# Patient Record
Sex: Female | Born: 1990 | Race: Black or African American | Hispanic: No | Marital: Married | Smoking: Never smoker
Health system: Southern US, Community
[De-identification: ages and names within clinical notes are randomized; demographics above are authoritative.]

## PROBLEM LIST (undated history)

## (undated) ENCOUNTER — Inpatient Hospital Stay (HOSPITAL_COMMUNITY): Payer: Self-pay

## (undated) ENCOUNTER — Emergency Department (HOSPITAL_COMMUNITY): Payer: Self-pay

## (undated) DIAGNOSIS — R200 Anesthesia of skin: Secondary | ICD-10-CM

## (undated) DIAGNOSIS — R202 Paresthesia of skin: Secondary | ICD-10-CM

## (undated) DIAGNOSIS — O9902 Anemia complicating childbirth: Secondary | ICD-10-CM

## (undated) DIAGNOSIS — E559 Vitamin D deficiency, unspecified: Secondary | ICD-10-CM

## (undated) HISTORY — DX: Anesthesia of skin: R20.0

## (undated) HISTORY — DX: Vitamin D deficiency, unspecified: E55.9

## (undated) HISTORY — DX: Paresthesia of skin: R20.2

## (undated) HISTORY — PX: EXTERNAL EAR SURGERY: SHX627

---

## 2012-10-31 ENCOUNTER — Ambulatory Visit (INDEPENDENT_AMBULATORY_CARE_PROVIDER_SITE_OTHER): Payer: BC Managed Care – PPO | Admitting: Neurology

## 2012-10-31 ENCOUNTER — Encounter: Payer: Self-pay | Admitting: Neurology

## 2012-10-31 VITALS — BP 118/80 | HR 62 | Ht 67.0 in | Wt 220.0 lb

## 2012-10-31 DIAGNOSIS — R209 Unspecified disturbances of skin sensation: Secondary | ICD-10-CM

## 2012-10-31 DIAGNOSIS — E559 Vitamin D deficiency, unspecified: Secondary | ICD-10-CM | POA: Insufficient documentation

## 2012-10-31 DIAGNOSIS — R202 Paresthesia of skin: Secondary | ICD-10-CM | POA: Insufficient documentation

## 2012-10-31 DIAGNOSIS — R200 Anesthesia of skin: Secondary | ICD-10-CM | POA: Insufficient documentation

## 2012-10-31 NOTE — Progress Notes (Signed)
[name] is a 22 years old right-handed African American female, referred by the primary care at [facility] G. Dr. Lily Peer for evaluation of numbness  In December 2013, she noticed intermittent numbness involving bilateral anterior thigh and, also warming sensation, each episode lasting few minutes but multiple episodes throughout the day, the symptoms last for a few months, during that episode she denies gait difficulty, no muscle weakness, no bowel bladder incontinence no low back pain  Recent few weeks, she also noticed intermittent bilateral fingertips paresthesia, involving both right and left, no weakness, no visual change, no dysarthria  Laboratory evaluation showed a normal CMP, CPK A1c CBC TSH, that was documented low vitamin D, 22 ng per ML, she is on supplement,  Review of Systems  Out of a complete 14 system review, the patient complains of only the following symptoms, and all other reviewed systems are negative.   Constitutional:   N/A Cardiovascular:  N/A Ear/Nose/Throat:  N/A Skin: Itching Eyes: N/A Respiratory: N/A Gastroitestinal: N/A    Hematology/Lymphatic:  N/A Endocrine:  N/A Musculoskeletal: Cramps Allergy/Immunology: N/A Neurological: Headache, numbness, weakness tremor Psychiatric:    N/A  PHYSICAL EXAMINATOINS:  Generalized: In no acute distress  Neck: Supple, no carotid bruits   Cardiac: Regular rate rhythm  Pulmonary: Clear to auscultation bilaterally  Musculoskeletal: No deformity  Neurological examination  Mentation: Alert oriented to time, place, history taking, and causual conversation  Cranial nerve II-XII: Pupils were equal round reactive to light extraocular movements were full, visual field were full on confrontational test. facial sensation and strength were normal. hearing was intact to finger rubbing bilaterally. Uvula tongue midline.  head turning and shoulder shrug and were normal and symmetric.Tongue protrusion into cheek strength was  normal.  Motor: normal tone, bulk and strength.  Sensory: Intact to fine touch, pinprick, preserved vibratory sensation, and proprioception at toes.  Coordination: Normal finger to nose, heel-to-shin bilaterally there was no truncal ataxia  Gait: Rising up from seated position without assistance, normal stance, without trunk ataxia, moderate stride, good arm swing, smooth turning, able to perform tiptoe, and heel walking without difficulty.   Romberg signs: Negative  Deep tendon reflexes: Brachioradialis 2/2, biceps 2/2, triceps 2/2, patellar 2/2, Achilles 2/2, plantar responses were flexor bilaterally.   Assessment and plan 22 years old female, with intermittent numbness, essentially normal neurological examination, MRI of the brain to rule out central nervous system etiology, Laboratory evaluation, including vitamin B12, inflammatory markers I will call her result, if there is abnormality, will proceed with further evaluation, if it is all normal, I will continue to observe her symptoms,

## 2012-11-01 LAB — ANA: Anti Nuclear Antibody(ANA): NEGATIVE

## 2012-11-01 LAB — RPR: RPR: NONREACTIVE

## 2012-11-01 LAB — VITAMIN B12: Vitamin B-12: 513 pg/mL (ref 211–946)

## 2012-11-01 LAB — C-REACTIVE PROTEIN: CRP: 4.2 mg/L (ref 0.0–4.9)

## 2012-11-01 NOTE — Progress Notes (Signed)
Quick Note:  I have called and left message that was normal. ______

## 2012-11-07 ENCOUNTER — Ambulatory Visit (INDEPENDENT_AMBULATORY_CARE_PROVIDER_SITE_OTHER): Payer: BC Managed Care – PPO

## 2012-11-07 DIAGNOSIS — E559 Vitamin D deficiency, unspecified: Secondary | ICD-10-CM

## 2012-11-07 DIAGNOSIS — R200 Anesthesia of skin: Secondary | ICD-10-CM

## 2012-11-07 DIAGNOSIS — R202 Paresthesia of skin: Secondary | ICD-10-CM

## 2012-11-07 DIAGNOSIS — R209 Unspecified disturbances of skin sensation: Secondary | ICD-10-CM

## 2012-11-08 MED ORDER — GADOPENTETATE DIMEGLUMINE 469.01 MG/ML IV SOLN: 20.0000 mL | Freq: Once | INTRAVENOUS | Status: AC | PRN

## 2012-11-08 NOTE — Procedures (Signed)
[facility]  NEUROIMAGING REPORT   STUDY DATE: 11/07/12 PATIENT NAME: Dawn Norman DOB: 08/14/1990 MRN: [id number]  ORDERING CLINICIAN: Levert Feinstein, MD  CLINICAL HISTORY: 22 year old female with numbness.  EXAM: MRI brain (with and without)  TECHNIQUE: MRI of the brain with and without contrast was obtained utilizing 5 mm axial slices with T1, T2, T2 flair, T2 star gradient echo and diffusion weighted views.  T1 sagittal, T2 coronal and postcontrast views in the axial and coronal plane were obtained. CONTRAST: 20ml magnevist IMAGING SITE: [facility] [address]   FINDINGS:  No abnormal lesions are seen on diffusion-weighted views to suggest acute ischemia. The cortical sulci, fissures and cisterns are normal in size and appearance. Lateral, third and fourth ventricle are normal in size and appearance. No extra-axial fluid collections are seen. No evidence of mass effect or midline shift.    Left frontal, periventricular T2 hyperintensity measuring 7x20mm. No abnormal lesions are seen on post contrast views.    On sagittal views the posterior fossa, pituitary gland and corpus callosum are notable for prominent, slightly enlarged posterior pituitary bright spot (5x31mm on sagittal views). No evidence of intracranial hemorrhage on gradient-echo views. The orbits and their contents, paranasal sinuses and calvarium are unremarkable.  Intracranial flow voids are present.  IMPRESSION:  Abnormal MRI brain (with and without) demonstrating: 1. Left frontal, periventricular T2 hyperintensity measuring 7x63mm. No abnormal lesions are seen on post contrast views.  Considerations include autoimmune, inflammatory, post-infectious, or microvascular ischemic etiologies. 2. Slightly enlarged posterior pituitary bright spot (5x65mm on sagittal views). Could be a normal variant. Consider followup MRI pituitary protocol imaging if clinically indicated.   INTERPRETING PHYSICIAN:  Suanne Marker, MD Certified in Neurology, Neurophysiology and Neuroimaging  [facility] [address] [place], [place] [address] [phone]

## 2012-11-20 ENCOUNTER — Telehealth: Payer: Self-pay | Admitting: Neurology

## 2012-11-20 NOTE — Telephone Encounter (Signed)
I have called her, mild abnormal MRI brain (with and without) demonstrating:  1. Left frontal, periventricular T2 hyperintensity measuring 7x21mm. No abnormal lesions are seen on post contrast views. Considerations include autoimmune, inflammatory, post-infectious, or microvascular ischemic etiologies.  2. Slightly enlarged posterior pituitary bright spot (5x59mm on sagittal views). Could be a normal variant. Consider followup MRI pituitary protocol imaging if clinically indicated.  There is no need for further evaluations.

## 2015-10-16 ENCOUNTER — Ambulatory Visit: Payer: Self-pay | Admitting: Women's Health

## 2015-10-16 ENCOUNTER — Encounter: Payer: Self-pay | Admitting: Women's Health

## 2015-10-18 NOTE — Progress Notes (Addendum)
ERRONEOUS ENCOUNTER: This is in my open chart but I don 't think I saw her  Can you delete?

## 2015-10-28 NOTE — Addendum Note (Signed)
Addended by: Alen Blew on: 10/28/2015 05:28 PM   Modules accepted: Level of Service, SmartSet

## 2015-10-28 NOTE — Progress Notes (Signed)
This encounter was created in error - please disregard.

## 2015-10-30 ENCOUNTER — Ambulatory Visit: Payer: Self-pay | Admitting: Women's Health

## 2015-11-12 ENCOUNTER — Ambulatory Visit (INDEPENDENT_AMBULATORY_CARE_PROVIDER_SITE_OTHER): Payer: BLUE CROSS/BLUE SHIELD | Admitting: Women's Health

## 2015-11-12 ENCOUNTER — Encounter: Payer: Self-pay | Admitting: Women's Health

## 2015-11-12 VITALS — BP 120/82 | Ht 68.0 in | Wt 232.0 lb

## 2015-11-12 DIAGNOSIS — Z01419 Encounter for gynecological examination (general) (routine) without abnormal findings: Secondary | ICD-10-CM | POA: Diagnosis not present

## 2015-11-12 DIAGNOSIS — Z30011 Encounter for initial prescription of contraceptive pills: Secondary | ICD-10-CM | POA: Diagnosis not present

## 2015-11-12 MED ORDER — NORGESTIMATE-ETH ESTRADIOL 0.25-35 MG-MCG PO TABS: 1.0000 | ORAL_TABLET | Freq: Every day | ORAL | Status: DC

## 2015-11-12 NOTE — Progress Notes (Signed)
Dawn Norman 01-Dec-1990 [id number]    History:    Presents for annual exam.  Monthly cycle/virgin. Has not received gardasil. Has not had a Pap. Getting married in October would like to start on OCs. Partner [place] also.   Past medical history, past surgical history, family history and social history were all reviewed and documented in the EPIC chart. 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 Parents healthy.  ROS:  A ROS was performed and pertinent positives and negatives are included.  Exam:  Filed Vitals:   11/12/15 1440  BP: 120/82    General appearance:  Normal Thyroid:  Symmetrical, normal in size, without palpable masses or nodularity. Respiratory  Auscultation:  Clear without wheezing or rhonchi Cardiovascular  Auscultation:  Regular rate, without rubs, murmurs or gallops  Edema/varicosities:  Not grossly evident Abdominal  Soft,nontender, without masses, guarding or rebound.  Liver/spleen:  No organomegaly noted  Hernia:  None appreciated  Skin  Inspection:  Grossly normal   Breasts: Examined lying and sitting.     Right: Without masses, retractions, discharge or axillary adenopathy.     Left: Without masses, retractions, discharge or axillary adenopathy. Gentitourinary   Inguinal/mons:  Normal without inguinal adenopathy  External genitalia:  Normal  BUS/Urethra/Skene's glands:  Normal  Vagina:  Normal  Cervix:  Normal  Uterus:   normal in size, shape and contour.  Midline and mobile  Adnexa/parametria:     Rt: Without masses or tenderness.   Lt: Without masses or tenderness.  Anus and perineum: Normal    Assessment/Plan:  25 y.o. SBF virgin for annual exam with no complaints, requesting to start on OCs prior to marriage.  Contraception management Gardasil  Plan: Gardasil information given and reviewed. First gardasil given, return to office in 2 and 6 months to complete the series. Contraception options reviewed would like to try pills,  Sprintec prescription, proper use, slight risk for blood clots and strokes reviewed. Start up instructions reviewed will start next month, will call if problems. SBE's, exercise, calcium rich diet, MVI daily encouraged. Encouraged to decrease calories for weight loss. CBC, rubella, UA, Pap.  [place], 3:24 PM 11/12/2015

## 2015-11-12 NOTE — Patient Instructions (Signed)

## 2015-11-13 LAB — URINALYSIS W MICROSCOPIC + REFLEX CULTURE
BILIRUBIN URINE: NEGATIVE
Casts: NONE SEEN [LPF]
Crystals: NONE SEEN [HPF]
Glucose, UA: NEGATIVE
Hgb urine dipstick: NEGATIVE
KETONES UR: NEGATIVE
Leukocytes, UA: NEGATIVE
NITRITE: NEGATIVE
PH: 6.5 (ref 5.0–8.0)
Protein, ur: NEGATIVE
SPECIFIC GRAVITY, URINE: 1.025 (ref 1.001–1.035)
Yeast: NONE SEEN [HPF]

## 2015-11-14 LAB — URINE CULTURE

## 2015-11-16 LAB — PAP IG W/ RFLX HPV ASCU

## 2015-12-28 ENCOUNTER — Ambulatory Visit (INDEPENDENT_AMBULATORY_CARE_PROVIDER_SITE_OTHER): Payer: BLUE CROSS/BLUE SHIELD | Admitting: *Deleted

## 2015-12-28 DIAGNOSIS — Z23 Encounter for immunization: Secondary | ICD-10-CM | POA: Diagnosis not present

## 2015-12-29 ENCOUNTER — Telehealth: Payer: Self-pay | Admitting: *Deleted

## 2015-12-29 NOTE — Telephone Encounter (Signed)
Pt is getting married on Oct 1st, it appears that will be when her cycle will start, pt would like to know now how she can start taking her pills to ensure not cycle. Please advise

## 2015-12-29 NOTE — Telephone Encounter (Signed)
Pt informed with the below note, pt call back if questions.

## 2015-12-29 NOTE — Telephone Encounter (Signed)
Tell her to start her pills on the Sunday after her cycle if she is not already started her pills now. If she is currently on pills take 4 weeks of active pills (prescription placebo week and starting new pack) and then cycle after the fourth week and if cycle  is still going to  interfere with her marriage, have her take 4 active weeks the following month also. So she will take 4 weeks of active pills instead of 3 and then have a cycle that should hopefully postpone her cycle until after she returns from her honeymoon. She was going to start pills in May, I am not sure if she did. Let me know if she has questions

## 2016-05-06 ENCOUNTER — Ambulatory Visit: Payer: BLUE CROSS/BLUE SHIELD | Admitting: *Deleted

## 2016-10-24 ENCOUNTER — Encounter: Payer: Self-pay | Admitting: Women's Health

## 2016-10-24 ENCOUNTER — Ambulatory Visit (INDEPENDENT_AMBULATORY_CARE_PROVIDER_SITE_OTHER): Payer: BLUE CROSS/BLUE SHIELD | Admitting: Women's Health

## 2016-10-24 VITALS — BP 115/72

## 2016-10-24 DIAGNOSIS — N898 Other specified noninflammatory disorders of vagina: Secondary | ICD-10-CM

## 2016-10-24 DIAGNOSIS — N760 Acute vaginitis: Secondary | ICD-10-CM

## 2016-10-24 DIAGNOSIS — B9689 Other specified bacterial agents as the cause of diseases classified elsewhere: Secondary | ICD-10-CM

## 2016-10-24 DIAGNOSIS — R1032 Left lower quadrant pain: Secondary | ICD-10-CM | POA: Diagnosis not present

## 2016-10-24 LAB — WET PREP FOR TRICH, YEAST, CLUE
TRICH WET PREP: NONE SEEN
YEAST WET PREP: NONE SEEN

## 2016-10-24 MED ORDER — METRONIDAZOLE 500 MG PO TABS: 500.0000 mg | ORAL_TABLET | Freq: Two times a day (BID) | ORAL | 0 refills | Status: DC

## 2016-10-24 NOTE — Patient Instructions (Signed)

## 2016-10-24 NOTE — Progress Notes (Signed)
Presents with intermittent left lower abdominal dull discomfort for 1 week. States has had some constipation. Denies vaginal discharge, urinary symptoms, back pain, nausea or fever.  LMP 09/30/2015 normal cycle, cycle due tomorrow. Had been on Sprintec until December, desiring conception, has used no contraception since stopping OCs.   Exam: Appears well, no CVAT, abdomen soft without rebound or radiation of pain. External genitalia within normal limits, speculum exam moderate amount of a white adherent discharge with odor noted, wet prep positive for moderate clues, TNTC bacteria. Bimanual no CMT or adnexal tenderness, no pain with exam.  Bacteria vaginosis Left lower abdominal pain  Plan: Flagyl 500 twice daily for 7 days, alcohol precautions reviewed, instructed to call if no relief of discharge. Will check Qual pregnancy. Ovulation timing and predictors reviewed. Encouraged increased intercourse frequency. Prenatal vitamin daily, safe pregnancy behaviors reviewed.. Reports sickle cell trait negative. Constipation prevention discussed.

## 2016-10-25 LAB — HCG, SERUM, QUALITATIVE: Preg, Serum: NEGATIVE

## 2016-11-15 ENCOUNTER — Encounter: Payer: Self-pay | Admitting: Women's Health

## 2016-11-15 ENCOUNTER — Ambulatory Visit (INDEPENDENT_AMBULATORY_CARE_PROVIDER_SITE_OTHER): Payer: BLUE CROSS/BLUE SHIELD | Admitting: Women's Health

## 2016-11-15 VITALS — BP 122/80 | Ht 68.0 in | Wt 237.0 lb

## 2016-11-15 DIAGNOSIS — Z01419 Encounter for gynecological examination (general) (routine) without abnormal findings: Secondary | ICD-10-CM

## 2016-11-15 LAB — CBC WITH DIFFERENTIAL/PLATELET
BASOS PCT: 1 %
Basophils Absolute: 61 cells/uL (ref 0–200)
Eosinophils Absolute: 305 cells/uL (ref 15–500)
Eosinophils Relative: 5 %
HEMATOCRIT: 34.9 % — AB (ref 35.0–45.0)
Hemoglobin: 11.7 g/dL (ref 11.7–15.5)
LYMPHS PCT: 43 %
Lymphs Abs: 2623 cells/uL (ref 850–3900)
MCH: 27.8 pg (ref 27.0–33.0)
MCHC: 33.5 g/dL (ref 32.0–36.0)
MCV: 82.9 fL (ref 80.0–100.0)
MONO ABS: 549 {cells}/uL (ref 200–950)
MONOS PCT: 9 %
MPV: 10.5 fL (ref 7.5–12.5)
NEUTROS ABS: 2562 {cells}/uL (ref 1500–7800)
Neutrophils Relative %: 42 %
PLATELETS: 319 10*3/uL (ref 140–400)
RBC: 4.21 MIL/uL (ref 3.80–5.10)
RDW: 15.6 % — AB (ref 11.0–15.0)
WBC: 6.1 10*3/uL (ref 3.8–10.8)

## 2016-11-15 MED ORDER — NORGESTIMATE-ETH ESTRADIOL 0.25-35 MG-MCG PO TABS: 1.0000 | ORAL_TABLET | Freq: Every day | ORAL | 4 refills | Status: DC

## 2016-11-15 NOTE — Progress Notes (Signed)
Dawn Norman 22-Dec-1990 [id number]    History:    Presents for annual exam. Monthly regular cycles, stopped OCs in November desires conception.  Same partner.  States sickle cell negative.  Past medical history, past surgical history, family history and social history were all reviewed and documented in the EPIC chart. 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  ROS:  A ROS was performed and pertinent positives and negatives are included.  Exam:  Vitals:   11/15/16 1540  BP: 122/80  Weight: 237 lb (107.5 kg)  Height: 5\' 8"  (1.727 m)   Body mass index is 36.04 kg/m.   General appearance:  Normal Thyroid:  Symmetrical, normal in size, without palpable masses or nodularity. Respiratory  Auscultation:  Clear without wheezing or rhonchi Cardiovascular  Auscultation:  Regular rate, without rubs, murmurs or gallops  Edema/varicosities:  Not grossly evident Abdominal  Soft,nontender, without masses, guarding or rebound.  Liver/spleen:  No organomegaly noted  Hernia:  None appreciated  Skin  Inspection:  Grossly normal   Breasts: Examined lying and sitting.     Right: Without masses, retractions, discharge or axillary adenopathy.     Left: Without masses, retractions, discharge or axillary adenopathy. Gentitourinary   Inguinal/mons:  Normal without inguinal adenopathy  External genitalia:  Normal  BUS/Urethra/Skene's glands:  Normal  Vagina:  Normal  Cervix:  Normal  Uterus:  normal in size, shape and contour.  Midline and mobile  Adnexa/parametria:     Rt: Without masses or tenderness.   Lt: Without masses or tenderness.  Anus and perineum: Normal  Digital rectal exam: Normal sphincter tone without palpated masses or tenderness  Assessment/Plan:  26 y.o.  MBW G0 for annual exam with no complaints.  Monthly regular cycles Desiring conception Obesity  Plan:  Conceptual timing/predictors reviewed. Reviewed importance of increasing intercourse frequency.  Return to office with missed cycle, aware we no longer deliver. SBE's, exercise, calcium rich diet, MVI daily encouraged. Safe pregnancy behaviors reviewed. Encouraged to decrease calories for weight loss. CBC.  Pap normall 2017, new screening guidelines reviewed.  [place], 4:21 PM 11/15/2016

## 2016-11-15 NOTE — Patient Instructions (Signed)
Health Maintenance, Female Adopting a healthy lifestyle and getting preventive care can go a long way to promote health and wellness. Talk with your health care provider about what schedule of regular examinations is right for you. This is a good chance for you to check in with your provider about disease prevention and staying healthy. In between checkups, there are plenty of things you can do on your own. Experts have done a lot of research about which lifestyle changes and preventive measures are most likely to keep you healthy. Ask your health care provider for more information. Weight and diet Eat a healthy diet  Be sure to include plenty of vegetables, fruits, low-fat dairy products, and lean protein.  Do not eat a lot of foods high in solid fats, added sugars, or salt.  Get regular exercise. This is one of the most important things you can do for your health.  Most adults should exercise for at least 150 minutes each week. The exercise should increase your heart rate and make you sweat (moderate-intensity exercise).  Most adults should also do strengthening exercises at least twice a week. This is in addition to the moderate-intensity exercise. Maintain a healthy weight  Body mass index (BMI) is a measurement that can be used to identify possible weight problems. It estimates body fat based on height and weight. Your health care provider can help determine your BMI and help you achieve or maintain a healthy weight.  For females 76 years of age and older:  A BMI below 18.5 is considered underweight.  A BMI of 18.5 to 24.9 is normal.  A BMI of 25 to 29.9 is considered overweight.  A BMI of 30 and above is considered obese. Watch levels of cholesterol and blood lipids  You should start having your blood tested for lipids and cholesterol at 26 years of age, then have this test every 5 years.  You may need to have your cholesterol levels checked more often if:  Your lipid or  cholesterol levels are high.  You are older than 26 years of age.  You are at high risk for heart disease. Cancer screening Lung Cancer  Lung cancer screening is recommended for adults 64-42 years old who are at high risk for lung cancer because of a history of smoking.  A yearly low-dose CT scan of the lungs is recommended for people who:  Currently smoke.  Have quit within the past 15 years.  Have at least a 30-pack-year history of smoking. A pack year is smoking an average of one pack of cigarettes a day for 1 year.  Yearly screening should continue until it has been 15 years since you quit.  Yearly screening should stop if you develop a health problem that would prevent you from having lung cancer treatment. Breast Cancer  Practice breast self-awareness. This means understanding how your breasts normally appear and feel.  It also means doing regular breast self-exams. Let your health care provider know about any changes, no matter how small.  If you are in your 20s or 30s, you should have a clinical breast exam (CBE) by a health care provider every 1-3 years as part of a regular health exam.  If you are 34 or older, have a CBE every year. Also consider having a breast X-ray (mammogram) every year.  If you have a family history of breast cancer, talk to your health care provider about genetic screening.  If you are at high risk for breast cancer, talk  to your health care provider about having an MRI and a mammogram every year.  Breast cancer gene (BRCA) assessment is recommended for women who have family members with BRCA-related cancers. BRCA-related cancers include:  Breast.  Ovarian.  Tubal.  Peritoneal cancers.  Results of the assessment will determine the need for genetic counseling and BRCA1 and BRCA2 testing. Cervical Cancer  Your health care provider may recommend that you be screened regularly for cancer of the pelvic organs (ovaries, uterus, and vagina).  This screening involves a pelvic examination, including checking for microscopic changes to the surface of your cervix (Pap test). You may be encouraged to have this screening done every 3 years, beginning at age 24.  For women ages 66-65, health care providers may recommend pelvic exams and Pap testing every 3 years, or they may recommend the Pap and pelvic exam, combined with testing for human papilloma virus (HPV), every 5 years. Some types of HPV increase your risk of cervical cancer. Testing for HPV may also be done on women of any age with unclear Pap test results.  Other health care providers may not recommend any screening for nonpregnant women who are considered low risk for pelvic cancer and who do not have symptoms. Ask your health care provider if a screening pelvic exam is right for you.  If you have had past treatment for cervical cancer or a condition that could lead to cancer, you need Pap tests and screening for cancer for at least 20 years after your treatment. If Pap tests have been discontinued, your risk factors (such as having a new sexual partner) need to be reassessed to determine if screening should resume. Some women have medical problems that increase the chance of getting cervical cancer. In these cases, your health care provider may recommend more frequent screening and Pap tests. Colorectal Cancer  This type of cancer can be detected and often prevented.  Routine colorectal cancer screening usually begins at 26 years of age and continues through 26 years of age.  Your health care provider may recommend screening at an earlier age if you have risk factors for colon cancer.  Your health care provider may also recommend using home test kits to check for hidden blood in the stool.  A small camera at the end of a tube can be used to examine your colon directly (sigmoidoscopy or colonoscopy). This is done to check for the earliest forms of colorectal cancer.  Routine  screening usually begins at age 41.  Direct examination of the colon should be repeated every 5-10 years through 26 years of age. However, you may need to be screened more often if early forms of precancerous polyps or small growths are found. Skin Cancer  Check your skin from head to toe regularly.  Tell your health care provider about any new moles or changes in moles, especially if there is a change in a mole's shape or color.  Also tell your health care provider if you have a mole that is larger than the size of a pencil eraser.  Always use sunscreen. Apply sunscreen liberally and repeatedly throughout the day.  Protect yourself by wearing long sleeves, pants, a wide-brimmed hat, and sunglasses whenever you are outside. Heart disease, diabetes, and high blood pressure  High blood pressure causes heart disease and increases the risk of stroke. High blood pressure is more likely to develop in:  People who have blood pressure in the high end of the normal range (130-139/85-89 mm Hg).  People who are overweight or obese.  People who are African American.  If you are 59-24 years of age, have your blood pressure checked every 3-5 years. If you are 34 years of age or older, have your blood pressure checked every year. You should have your blood pressure measured twice-once when you are at a hospital or clinic, and once when you are not at a hospital or clinic. Record the average of the two measurements. To check your blood pressure when you are not at a hospital or clinic, you can use:  An automated blood pressure machine at a pharmacy.  A home blood pressure monitor.  If you are between 29 years and 60 years old, ask your health care provider if you should take aspirin to prevent strokes.  Have regular diabetes screenings. This involves taking a blood sample to check your fasting blood sugar level.  If you are at a normal weight and have a low risk for diabetes, have this test once  every three years after 26 years of age.  If you are overweight and have a high risk for diabetes, consider being tested at a younger age or more often. Preventing infection Hepatitis B  If you have a higher risk for hepatitis B, you should be screened for this virus. You are considered at high risk for hepatitis B if:  You were born in a country where hepatitis B is common. Ask your health care provider which countries are considered high risk.  Your parents were born in a high-risk country, and you have not been immunized against hepatitis B (hepatitis B vaccine).  You have HIV or AIDS.  You use needles to inject street drugs.  You live with someone who has hepatitis B.  You have had sex with someone who has hepatitis B.  You get hemodialysis treatment.  You take certain medicines for conditions, including cancer, organ transplantation, and autoimmune conditions. Hepatitis C  Blood testing is recommended for:  Everyone born from [birth year] through 1965.  Anyone with known risk factors for hepatitis C. Sexually transmitted infections (STIs)  You should be screened for sexually transmitted infections (STIs) including gonorrhea and chlamydia if:  You are sexually active and are younger than 26 years of age.  You are older than 26 years of age and your health care provider tells you that you are at risk for this type of infection.  Your sexual activity has changed since you were last screened and you are at an increased risk for chlamydia or gonorrhea. Ask your health care provider if you are at risk.  If you do not have HIV, but are at risk, it may be recommended that you take a prescription medicine daily to prevent HIV infection. This is called pre-exposure prophylaxis (PrEP). You are considered at risk if:  You are sexually active and do not regularly use condoms or know the HIV status of your partner(s).  You take drugs by injection.  You are sexually active with a partner  who has HIV. Talk with your health care provider about whether you are at high risk of being infected with HIV. If you choose to begin PrEP, you should first be tested for HIV. You should then be tested every 3 months for as long as you are taking PrEP. Pregnancy  If you are premenopausal and you may become pregnant, ask your health care provider about preconception counseling.  If you may become pregnant, take 400 to 800 micrograms (mcg) of folic acid  every day.  If you want to prevent pregnancy, talk to your health care provider about birth control (contraception). Osteoporosis and menopause  Osteoporosis is a disease in which the bones lose minerals and strength with aging. This can result in serious bone fractures. Your risk for osteoporosis can be identified using a bone density scan.  If you are 65 years of age or older, or if you are at risk for osteoporosis and fractures, ask your health care provider if you should be screened.  Ask your health care provider whether you should take a calcium or vitamin D supplement to lower your risk for osteoporosis.  Menopause may have certain physical symptoms and risks.  Hormone replacement therapy may reduce some of these symptoms and risks. Talk to your health care provider about whether hormone replacement therapy is right for you. Follow these instructions at home:  Schedule regular health, dental, and eye exams.  Stay current with your immunizations.  Do not use any tobacco products including cigarettes, chewing tobacco, or electronic cigarettes.  If you are pregnant, do not drink alcohol.  If you are breastfeeding, limit how much and how often you drink alcohol.  Limit alcohol intake to no more than 1 drink per day for nonpregnant women. One drink equals 12 ounces of beer, 5 ounces of wine, or 1 ounces of hard liquor.  Do not use street drugs.  Do not share needles.  Ask your health care provider for help if you need support  or information about quitting drugs.  Tell your health care provider if you often feel depressed.  Tell your health care provider if you have ever been abused or do not feel safe at home. This information is not intended to replace advice given to you by your health care provider. Make sure you discuss any questions you have with your health care provider. Document Released: 01/24/2011 Document Revised: 12/17/2015 Document Reviewed: 04/14/2015 Elsevier Interactive Patient Education  2017 [employer] Carbohydrate Counting for Diabetes Mellitus, Adult Carbohydrate counting is a method for keeping track of how many carbohydrates you eat. Eating carbohydrates naturally increases the amount of sugar (glucose) in the blood. Counting how many carbohydrates you eat helps keep your blood glucose within normal limits, which helps you manage your diabetes (diabetes mellitus). It is important to know how many carbohydrates you can safely have in each meal. This is different for every person. A diet and nutrition specialist (registered dietitian) can help you make a meal plan and calculate how many carbohydrates you should have at each meal and snack. Carbohydrates are found in the following foods:  Grains, such as breads and cereals.  Dried beans and soy products.  Starchy vegetables, such as potatoes, peas, and corn.  Fruit and fruit juices.  Milk and yogurt.  Sweets and snack foods, such as cake, cookies, candy, chips, and soft drinks. How do I count carbohydrates? There are two ways to count carbohydrates in food. You can use either of the methods or a combination of both. Reading "Nutrition Facts" on packaged food  The "Nutrition Facts" list is included on the labels of almost all packaged foods and beverages in the [place] It includes:  The serving size.  Information about nutrients in each serving, including the grams (g) of carbohydrate per serving. To use the "Nutrition Facts":  Decide  how many servings you will have.  Multiply the number of servings by the number of carbohydrates per serving.  The resulting number is the total amount of   carbohydrates that you will be having. Learning standard serving sizes of other foods  When you eat foods containing carbohydrates that are not packaged or do not include "Nutrition Facts" on the label, you need to measure the servings in order to count the amount of carbohydrates:  Measure the foods that you will eat with a food scale or measuring cup, if needed.  Decide how many standard-size servings you will eat.  Multiply the number of servings by 15. Most carbohydrate-rich foods have about 15 g of carbohydrates per serving.  For example, if you eat 8 oz (170 g) of strawberries, you will have eaten 2 servings and 30 g of carbohydrates (2 servings x 15 g = 30 g).  For foods that have more than one food mixed, such as soups and casseroles, you must count the carbohydrates in each food that is included. The following list contains standard serving sizes of common carbohydrate-rich foods. Each of these servings has about 15 g of carbohydrates:   hamburger bun or  English muffin.   oz (15 mL) syrup.   oz (14 g) jelly.  1 slice of bread.  1 six-inch tortilla.  3 oz (85 g) cooked rice or pasta.  4 oz (113 g) cooked dried beans.  4 oz (113 g) starchy vegetable, such as peas, corn, or potatoes.  4 oz (113 g) hot cereal.  4 oz (113 g) mashed potatoes or  of a large baked potato.  4 oz (113 g) canned or frozen fruit.  4 oz (120 mL) fruit juice.  4-6 crackers.  6 chicken nuggets.  6 oz (170 g) unsweetened dry cereal.  6 oz (170 g) plain fat-free yogurt or yogurt sweetened with artificial sweeteners.  8 oz (240 mL) milk.  8 oz (170 g) fresh fruit or one small piece of fruit.  24 oz (680 g) popped popcorn. Example of carbohydrate counting Sample meal   3 oz (85 g) chicken breast.  6 oz (170 g) brown  rice.  4 oz (113 g) corn.  8 oz (240 mL) milk.  8 oz (170 g) strawberries with sugar-free whipped topping. Carbohydrate calculation  1. Identify the foods that contain carbohydrates:  Rice.  Corn.  Milk.  Strawberries. 2. Calculate how many servings you have of each food:  2 servings rice.  1 serving corn.  1 serving milk.  1 serving strawberries. 3. Multiply each number of servings by 15 g:  2 servings rice x 15 g = 30 g.  1 serving corn x 15 g = 15 g.  1 serving milk x 15 g = 15 g.  1 serving strawberries x 15 g = 15 g. 4. Add together all of the amounts to find the total grams of carbohydrates eaten:  30 g + 15 g + 15 g + 15 g = 75 g of carbohydrates total. This information is not intended to replace advice given to you by your health care provider. Make sure you discuss any questions you have with your health care provider. Document Released: 07/11/2005 Document Revised: 01/29/2016 Document Reviewed: 12/23/2015 Elsevier Interactive Patient Education  2017 [employer] Inc[employer]  

## 2016-12-23 ENCOUNTER — Encounter: Payer: Self-pay | Admitting: Obstetrics & Gynecology

## 2016-12-23 ENCOUNTER — Ambulatory Visit (INDEPENDENT_AMBULATORY_CARE_PROVIDER_SITE_OTHER): Payer: BLUE CROSS/BLUE SHIELD | Admitting: Obstetrics & Gynecology

## 2016-12-23 VITALS — BP 128/86

## 2016-12-23 DIAGNOSIS — Z3200 Encounter for pregnancy test, result unknown: Secondary | ICD-10-CM | POA: Diagnosis not present

## 2016-12-23 NOTE — Patient Instructions (Signed)
1. Encounter for confirmation of pregnancy test result with physical examination Probably menstruating, vs 1st trimester bleeding.  If Quant BHCG pos, will repeat Monday 6/4th.  Ob US per results.  Rhogam if Rh neg and Spont. Ab.  Continue PNVs.  Information and recommendations discussed with patient. - Beta HCG, Quant - ABO AND RH  - Antibody Screen  Dawn Norman, it was a pleasure to meet you today!  I will inform you of your results as soon as available.

## 2016-12-23 NOTE — Progress Notes (Signed)
    ELANAH OSMANOVIC [birth year] [id number]        26 y.o.  G0  LMP 11/24/2016.  HPT pos 5/30th  RP:  HPT pos, but started dark vaginal bleeding like a period yesterday 5/31st  HPI:  Attempting conception x 06/2016.  On PNVs.  Menses reg qmonth, normal flow.  LMP 11/24/2016.  HPT pos 5/30th.  Menstrual like vaginal bleeding x yesterday.  No pelvic pain.  No H/O Gono-Chlam.    Past medical history,surgical history, problem list, medications, allergies, family history and social history were all reviewed and documented in the EPIC chart.  Directed ROS with pertinent positives and negatives documented in the history of present illness/assessment and plan.  Exam:  There were no vitals filed for this visit. General appearance:  Normal  Abdo:  Soft, NT  Gyn:  Vulva normal           Speculum:  Dark menstrual like moderated flow from Endocervix.  Cervix closed.  Vagina normal.           Bimanual exam:  Uterus AV, normal volume, NT.  No adnexal mass felt, NT.  Assessment/Plan:  26 y.o. G0  1. Encounter for confirmation of pregnancy test result with physical examination Probably menstruating, vs 1st trimester bleeding.  If Quant BHCG pos, will repeat Monday 6/4th.  Ob US per results.  Rhogam if Rh neg and Spont. Ab.  Continue PNVs.  Information and recommendations discussed with patient. - Beta HCG, Quant - ABO AND RH  - Antibody Screen  Counseling >50% x 15 min on above issues.  Princess Bruins MD, 2:55 PM 12/23/2016

## 2016-12-23 NOTE — Addendum Note (Signed)
Addended by: Thurnell Garbe A on: 12/23/2016 04:47 PM   Modules accepted: Orders

## 2016-12-24 LAB — HCG, QUANTITATIVE, PREGNANCY: hCG, Beta Chain, Quant, S: <2 m[IU]/mL

## 2016-12-24 LAB — ABO AND RH: Rh Type: POSITIVE

## 2016-12-26 ENCOUNTER — Telehealth: Payer: Self-pay

## 2016-12-26 NOTE — Telephone Encounter (Signed)
Patient called for lab results. INformed Quant negative and Blood Type B+ so would not need Rhogam since positive.

## 2017-04-18 ENCOUNTER — Ambulatory Visit (INDEPENDENT_AMBULATORY_CARE_PROVIDER_SITE_OTHER): Payer: BLUE CROSS/BLUE SHIELD | Admitting: Women's Health

## 2017-04-18 ENCOUNTER — Encounter: Payer: Self-pay | Admitting: Women's Health

## 2017-04-18 VITALS — BP 118/80

## 2017-04-18 DIAGNOSIS — N926 Irregular menstruation, unspecified: Secondary | ICD-10-CM

## 2017-04-18 NOTE — Progress Notes (Signed)
Presents to discuss conception/desiring pregnancy. Stopped OCs 05/2016. Reports every 28 day cycle for 5-7 days. 3 times weekly intercourse. Husband age 26, recently diagnosed with diabetes , insulin had been suggested unable to afford, taking po meds and making lifestyle changes. Denies any problems with discharge, urinary symptoms, spotting. Preschool teacher. History of early SAB, blood type B positive. Denies sickle cell trait.   Exam: Appears well, obese.  Infertility  Plan: Semen analysis. Return to office day 3 of next cycle for estradiol, FSH, TSH and prolactin. Date 22-25 of cycle for progesterone level. Continue frequent intercourse, prenatal vitamin, safe pregnancy behaviors reviewed. Printed out handout on diabetic diet, reviewed good for both she and her husband, encouraged weight loss, exercise.

## 2017-04-18 NOTE — Patient Instructions (Signed)
DASH Eating Plan DASH stands for "Dietary Approaches to Stop Hypertension." The DASH eating plan is a healthy eating plan that has been shown to reduce high blood pressure (hypertension). It may also reduce your risk for type 2 diabetes, heart disease, and stroke. The DASH eating plan may also help with weight loss. What are tips for following this plan? General guidelines  Avoid eating more than 2,300 mg (milligrams) of salt (sodium) a day. If you have hypertension, you may need to reduce your sodium intake to 1,500 mg a day.  Limit alcohol intake to no more than 1 drink a day for nonpregnant women and 2 drinks a day for men. One drink equals 12 oz of beer, 5 oz of wine, or 1 oz of hard liquor.  Work with your health care provider to maintain a healthy body weight or to lose weight. Ask what an ideal weight is for you.  Get at least 30 minutes of exercise that causes your heart to beat faster (aerobic exercise) most days of the week. Activities may include walking, swimming, or biking.  Work with your health care provider or diet and nutrition specialist (dietitian) to adjust your eating plan to your individual calorie needs. Reading food labels  Check food labels for the amount of sodium per serving. Choose foods with less than 5 percent of the Daily Value of sodium. Generally, foods with less than 300 mg of sodium per serving fit into this eating plan.  To find whole grains, look for the word "whole" as the first word in the ingredient list. Shopping  Buy products labeled as "low-sodium" or "no salt added."  Buy fresh foods. Avoid canned foods and premade or frozen meals. Cooking  Avoid adding salt when cooking. Use salt-free seasonings or herbs instead of table salt or sea salt. Check with your health care provider or pharmacist before using salt substitutes.  Do not fry foods. Cook foods using healthy methods such as baking, boiling, grilling, and broiling instead.  Cook with  heart-healthy oils, such as olive, canola, soybean, or sunflower oil. Meal planning   Eat a balanced diet that includes: ? 5 or more servings of fruits and vegetables each day. At each meal, try to fill half of your plate with fruits and vegetables. ? Up to 6-8 servings of whole grains each day. ? Less than 6 oz of lean meat, poultry, or fish each day. A 3-oz serving of meat is about the same size as a deck of cards. One egg equals 1 oz. ? 2 servings of low-fat dairy each day. ? A serving of nuts, seeds, or beans 5 times each week. ? Heart-healthy fats. Healthy fats called Omega-3 fatty acids are found in foods such as flaxseeds and coldwater fish, like sardines, salmon, and mackerel.  Limit how much you eat of the following: ? Canned or prepackaged foods. ? Food that is high in trans fat, such as fried foods. ? Food that is high in saturated fat, such as fatty meat. ? Sweets, desserts, sugary drinks, and other foods with added sugar. ? Full-fat dairy products.  Do not salt foods before eating.  Try to eat at least 2 vegetarian meals each week.  Eat more home-cooked food and less restaurant, buffet, and fast food.  When eating at a restaurant, ask that your food be prepared with less salt or no salt, if possible. What foods are recommended? The items listed may not be a complete list. Talk with your dietitian about what   dietary choices are best for you. Grains Whole-grain or whole-wheat bread. Whole-grain or whole-wheat pasta. Brown rice. Oatmeal. Quinoa. Bulgur. Whole-grain and low-sodium cereals. Pita bread. Low-fat, low-sodium crackers. Whole-wheat flour tortillas. Vegetables Fresh or frozen vegetables (raw, steamed, roasted, or grilled). Low-sodium or reduced-sodium tomato and vegetable juice. Low-sodium or reduced-sodium tomato sauce and tomato paste. Low-sodium or reduced-sodium canned vegetables. Fruits All fresh, dried, or frozen fruit. Canned fruit in natural juice (without  added sugar). Meat and other protein foods Skinless chicken or turkey. Ground chicken or turkey. Pork with fat trimmed off. Fish and seafood. Egg whites. Dried beans, peas, or lentils. Unsalted nuts, nut butters, and seeds. Unsalted canned beans. Lean cuts of beef with fat trimmed off. Low-sodium, lean deli meat. Dairy Low-fat (1%) or fat-free (skim) milk. Fat-free, low-fat, or reduced-fat cheeses. Nonfat, low-sodium ricotta or cottage cheese. Low-fat or nonfat yogurt. Low-fat, low-sodium cheese. Fats and oils Soft margarine without trans fats. Vegetable oil. Low-fat, reduced-fat, or light mayonnaise and salad dressings (reduced-sodium). Canola, safflower, olive, soybean, and sunflower oils. Avocado. Seasoning and other foods Herbs. Spices. Seasoning mixes without salt. Unsalted popcorn and pretzels. Fat-free sweets. What foods are not recommended? The items listed may not be a complete list. Talk with your dietitian about what dietary choices are best for you. Grains Baked goods made with fat, such as croissants, muffins, or some breads. Dry pasta or rice meal packs. Vegetables Creamed or fried vegetables. Vegetables in a cheese sauce. Regular canned vegetables (not low-sodium or reduced-sodium). Regular canned tomato sauce and paste (not low-sodium or reduced-sodium). Regular tomato and vegetable juice (not low-sodium or reduced-sodium). Pickles. Olives. Fruits Canned fruit in a light or heavy syrup. Fried fruit. Fruit in cream or butter sauce. Meat and other protein foods Fatty cuts of meat. Ribs. Fried meat. Bacon. Sausage. Bologna and other processed lunch meats. Salami. Fatback. Hotdogs. Bratwurst. Salted nuts and seeds. Canned beans with added salt. Canned or smoked fish. Whole eggs or egg yolks. Chicken or turkey with skin. Dairy Whole or 2% milk, cream, and half-and-half. Whole or full-fat cream cheese. Whole-fat or sweetened yogurt. Full-fat cheese. Nondairy creamers. Whipped toppings.  Processed cheese and cheese spreads. Fats and oils Butter. Stick margarine. Lard. Shortening. Ghee. Bacon fat. Tropical oils, such as coconut, palm kernel, or palm oil. Seasoning and other foods Salted popcorn and pretzels. Onion salt, garlic salt, seasoned salt, table salt, and sea salt. Worcestershire sauce. Tartar sauce. Barbecue sauce. Teriyaki sauce. Soy sauce, including reduced-sodium. Steak sauce. Canned and packaged gravies. Fish sauce. Oyster sauce. Cocktail sauce. Horseradish that you find on the shelf. Ketchup. Mustard. Meat flavorings and tenderizers. Bouillon cubes. Hot sauce and Tabasco sauce. Premade or packaged marinades. Premade or packaged taco seasonings. Relishes. Regular salad dressings. Where to find more information:  National Heart, Lung, and Blood Institute: www.nhlbi.nih.gov  American Heart Association: www.heart.org Summary  The DASH eating plan is a healthy eating plan that has been shown to reduce high blood pressure (hypertension). It may also reduce your risk for type 2 diabetes, heart disease, and stroke.  With the DASH eating plan, you should limit salt (sodium) intake to 2,300 mg a day. If you have hypertension, you may need to reduce your sodium intake to 1,500 mg a day.  When on the DASH eating plan, aim to eat more fresh fruits and vegetables, whole grains, lean proteins, low-fat dairy, and heart-healthy fats.  Work with your health care provider or diet and nutrition specialist (dietitian) to adjust your eating plan to your individual   calorie needs. This information is not intended to replace advice given to you by your health care provider. Make sure you discuss any questions you have with your health care provider. Document Released: 06/30/2011 Document Revised: 07/04/2016 Document Reviewed: 07/04/2016 Elsevier Interactive Patient Education  2017 [place] for Diabetes Mellitus, Adult Carbohydrate counting is a method for  keeping track of how many carbohydrates you eat. Eating carbohydrates naturally increases the amount of sugar (glucose) in the blood. Counting how many carbohydrates you eat helps keep your blood glucose within normal limits, which helps you manage your diabetes (diabetes mellitus). It is important to know how many carbohydrates you can safely have in each meal. This is different for every person. A diet and nutrition specialist (registered dietitian) can help you make a meal plan and calculate how many carbohydrates you should have at each meal and snack. Carbohydrates are found in the following foods:  Grains, such as breads and cereals.  Dried beans and soy products.  Starchy vegetables, such as potatoes, peas, and corn.  Fruit and fruit juices.  Milk and yogurt.  Sweets and snack foods, such as cake, cookies, candy, chips, and soft drinks.  How do I count carbohydrates? There are two ways to count carbohydrates in food. You can use either of the methods or a combination of both. Reading "Nutrition Facts" on packaged food The "Nutrition Facts" list is included on the labels of almost all packaged foods and beverages in the [place] It includes:  The serving size.  Information about nutrients in each serving, including the grams (g) of carbohydrate per serving.  To use the "Nutrition Facts":  Decide how many servings you will have.  Multiply the number of servings by the number of carbohydrates per serving.  The resulting number is the total amount of carbohydrates that you will be having.  Learning standard serving sizes of other foods When you eat foods containing carbohydrates that are not packaged or do not include "Nutrition Facts" on the label, you need to measure the servings in order to count the amount of carbohydrates:  Measure the foods that you will eat with a food scale or measuring cup, if needed.  Decide how many standard-size servings you will eat.  Multiply the  number of servings by 15. Most carbohydrate-rich foods have about 15 g of carbohydrates per serving. ? For example, if you eat 8 oz (170 g) of strawberries, you will have eaten 2 servings and 30 g of carbohydrates (2 servings x 15 g = 30 g).  For foods that have more than one food mixed, such as soups and casseroles, you must count the carbohydrates in each food that is included.  The following list contains standard serving sizes of common carbohydrate-rich foods. Each of these servings has about 15 g of carbohydrates:   hamburger bun or  English muffin.   oz (15 mL) syrup.   oz (14 g) jelly.  1 slice of bread.  1 six-inch tortilla.  3 oz (85 g) cooked rice or pasta.  4 oz (113 g) cooked dried beans.  4 oz (113 g) starchy vegetable, such as peas, corn, or potatoes.  4 oz (113 g) hot cereal.  4 oz (113 g) mashed potatoes or  of a large baked potato.  4 oz (113 g) canned or frozen fruit.  4 oz (120 mL) fruit juice.  4-6 crackers.  6 chicken nuggets.  6 oz (170 g) unsweetened dry cereal.  6 oz (170 g)  plain fat-free yogurt or yogurt sweetened with artificial sweeteners.  8 oz (240 mL) milk.  8 oz (170 g) fresh fruit or one small piece of fruit.  24 oz (680 g) popped popcorn.  Example of carbohydrate counting Sample meal  3 oz (85 g) chicken breast.  6 oz (170 g) brown rice.  4 oz (113 g) corn.  8 oz (240 mL) milk.  8 oz (170 g) strawberries with sugar-free whipped topping. Carbohydrate calculation 1. Identify the foods that contain carbohydrates: ? Rice. ? Corn. ? Milk. ? Strawberries. 2. Calculate how many servings you have of each food: ? 2 servings rice. ? 1 serving corn. ? 1 serving milk. ? 1 serving strawberries. 3. Multiply each number of servings by 15 g: ? 2 servings rice x 15 g = 30 g. ? 1 serving corn x 15 g = 15 g. ? 1 serving milk x 15 g = 15 g. ? 1 serving strawberries x 15 g = 15 g. 4. Add together all of the amounts to find  the total grams of carbohydrates eaten: ? 30 g + 15 g + 15 g + 15 g = 75 g of carbohydrates total. This information is not intended to replace advice given to you by your health care provider. Make sure you discuss any questions you have with your health care provider. Document Released: 07/11/2005 Document Revised: 01/29/2016 Document Reviewed: 12/23/2015 Elsevier Interactive Patient Education  [employer].

## 2017-11-01 ENCOUNTER — Ambulatory Visit: Payer: Managed Care, Other (non HMO) | Admitting: Women's Health

## 2017-11-01 ENCOUNTER — Encounter: Payer: Self-pay | Admitting: Women's Health

## 2017-11-01 VITALS — BP 118/88 | Ht 68.0 in | Wt 228.0 lb

## 2017-11-01 DIAGNOSIS — Z3201 Encounter for pregnancy test, result positive: Secondary | ICD-10-CM

## 2017-11-01 DIAGNOSIS — N926 Irregular menstruation, unspecified: Secondary | ICD-10-CM | POA: Diagnosis not present

## 2017-11-01 DIAGNOSIS — O3680X0 Pregnancy with inconclusive fetal viability, not applicable or unspecified: Secondary | ICD-10-CM

## 2017-11-01 LAB — PREGNANCY, URINE: Preg Test, Ur: POSITIVE — AB

## 2017-11-01 NOTE — Progress Notes (Signed)
27 year old MBF presents with positive home UPT.  Desired pregnancy, has used no contraception since December 2017.   Had a home positive UPT June 2018 with negative UPT and negative qualitative hCG here at our office .  Regular monthly cycle.  LMP March 6 normal cycle.  Denies bleeding/spotting, vaginal discharge, abdominal pain, urinary symptoms or fever.  Pre-k Pharmacist, hospital.  Gardasil series completed.  Exam: Appears well.  Very happy with pregnancy.  Positive UPT  Early pregnancy/5 weeks  Plan: Congratulations given, continue prenatal vitamin daily.  Safe pregnancy behaviors discussed.  Schedule viability screening ultrasound first week of May.

## 2017-11-01 NOTE — Patient Instructions (Signed)
First Trimester of Pregnancy The first trimester of pregnancy is from week 1 until the end of week 13 (months 1 through 3). During this time, your baby will begin to develop inside you. At 6-8 weeks, the eyes and face are formed, and the heartbeat can be seen on ultrasound. At the end of 12 weeks, all the baby's organs are formed. Prenatal care is all the medical care you receive before the birth of your baby. Make sure you get good prenatal care and follow all of your doctor's instructions. Follow these instructions at home: Medicines  Take over-the-counter and prescription medicines only as told by your doctor. Some medicines are safe and some medicines are not safe during pregnancy.  Take a prenatal vitamin that contains at least 600 micrograms (mcg) of folic acid.  If you have trouble pooping (constipation), take medicine that will make your stool soft (stool softener) if your doctor approves. Eating and drinking  Eat regular, healthy meals.  Your doctor will tell you the amount of weight gain that is right for you.  Avoid raw meat and uncooked cheese.  If you feel sick to your stomach (nauseous) or throw up (vomit): ? Eat 4 or 5 small meals a day instead of 3 large meals. ? Try eating a few soda crackers. ? Drink liquids between meals instead of during meals.  To prevent constipation: ? Eat foods that are high in fiber, like fresh fruits and vegetables, whole grains, and beans. ? Drink enough fluids to keep your pee (urine) clear or pale yellow. Activity  Exercise only as told by your doctor. Stop exercising if you have cramps or pain in your lower belly (abdomen) or low back.  Do not exercise if it is too hot, too humid, or if you are in a place of great height (high altitude).  Try to avoid standing for long periods of time. Move your legs often if you must stand in one place for a long time.  Avoid heavy lifting.  Wear low-heeled shoes. Sit and stand up straight.  You  can have sex unless your doctor tells you not to. Relieving pain and discomfort  Wear a good support bra if your breasts are sore.  Take warm water baths (sitz baths) to soothe pain or discomfort caused by hemorrhoids. Use hemorrhoid cream if your doctor says it is okay.  Rest with your legs raised if you have leg cramps or low back pain.  If you have puffy, bulging veins (varicose veins) in your legs: ? Wear support hose or compression stockings as told by your doctor. ? Raise (elevate) your feet for 15 minutes, 3-4 times a day. ? Limit salt in your food. Prenatal care  Schedule your prenatal visits by the twelfth week of pregnancy.  Write down your questions. Take them to your prenatal visits.  Keep all your prenatal visits as told by your doctor. This is important. Safety  Wear your seat belt at all times when driving.  Make a list of emergency phone numbers. The list should include numbers for family, friends, the hospital, and police and fire departments. General instructions  Ask your doctor for a referral to a local prenatal class. Begin classes no later than at the start of month 6 of your pregnancy.  Ask for help if you need counseling or if you need help with nutrition. Your doctor can give you advice or tell you where to go for help.  Do not use hot tubs, steam rooms, or   saunas.  Do not douche or use tampons or scented sanitary pads.  Do not cross your legs for long periods of time.  Avoid all herbs and alcohol. Avoid drugs that are not approved by your doctor.  Do not use any tobacco products, including cigarettes, chewing tobacco, and electronic cigarettes. If you need help quitting, ask your doctor. You may get counseling or other support to help you quit.  Avoid cat litter boxes and soil used by cats. These carry germs that can cause birth defects in the baby and can cause a loss of your baby (miscarriage) or stillbirth.  Visit your dentist. At home, brush  your teeth with a soft toothbrush. Be gentle when you floss. Contact a doctor if:  You are dizzy.  You have mild cramps or pressure in your lower belly.  You have a nagging pain in your belly area.  You continue to feel sick to your stomach, you throw up, or you have watery poop (diarrhea).  You have a bad smelling fluid coming from your vagina.  You have pain when you pee (urinate).  You have increased puffiness (swelling) in your face, hands, legs, or ankles. Get help right away if:  You have a fever.  You are leaking fluid from your vagina.  You have spotting or bleeding from your vagina.  You have very bad belly cramping or pain.  You gain or lose weight rapidly.  You throw up blood. It may look like coffee grounds.  You are around people who have German measles, fifth disease, or chickenpox.  You have a very bad headache.  You have shortness of breath.  You have any kind of trauma, such as from a fall or a car accident. Summary  The first trimester of pregnancy is from week 1 until the end of week 13 (months 1 through 3).  To take care of yourself and your unborn baby, you will need to eat healthy meals, take medicines only if your doctor tells you to do so, and do activities that are safe for you and your baby.  Keep all follow-up visits as told by your doctor. This is important as your doctor will have to ensure that your baby is healthy and growing well. This information is not intended to replace advice given to you by your health care provider. Make sure you discuss any questions you have with your health care provider. Document Released: 12/28/2007 Document Revised: 07/19/2016 Document Reviewed: 07/19/2016 Elsevier Interactive Patient Education  2017 [employer] Inc.  

## 2017-11-29 ENCOUNTER — Ambulatory Visit: Payer: Managed Care, Other (non HMO) | Admitting: Women's Health

## 2017-11-29 ENCOUNTER — Other Ambulatory Visit: Payer: Managed Care, Other (non HMO)

## 2018-01-19 ENCOUNTER — Inpatient Hospital Stay (HOSPITAL_COMMUNITY): Payer: Medicaid Other

## 2018-01-19 ENCOUNTER — Inpatient Hospital Stay (HOSPITAL_COMMUNITY)
Admission: AD | Admit: 2018-01-19 | Discharge: 2018-01-22 | DRG: 779 | Disposition: A | Discharge status: Home / Self Care | Payer: Medicaid Other

## 2018-01-19 ENCOUNTER — Encounter (HOSPITAL_COMMUNITY): Payer: Self-pay | Admitting: *Deleted

## 2018-01-19 ENCOUNTER — Other Ambulatory Visit: Payer: Self-pay

## 2018-01-19 DIAGNOSIS — O411220 Chorioamnionitis, second trimester, not applicable or unspecified: Secondary | ICD-10-CM | POA: Clinically undetermined

## 2018-01-19 DIAGNOSIS — O99012 Anemia complicating pregnancy, second trimester: Secondary | ICD-10-CM | POA: Diagnosis present

## 2018-01-19 DIAGNOSIS — Z3A16 16 weeks gestation of pregnancy: Secondary | ICD-10-CM

## 2018-01-19 DIAGNOSIS — O4290 Premature rupture of membranes, unspecified as to length of time between rupture and onset of labor, unspecified weeks of gestation: Secondary | ICD-10-CM | POA: Diagnosis present

## 2018-01-19 DIAGNOSIS — O021 Missed abortion: Secondary | ICD-10-CM | POA: Diagnosis present

## 2018-01-19 DIAGNOSIS — O42912 Preterm premature rupture of membranes, unspecified as to length of time between rupture and onset of labor, second trimester: Secondary | ICD-10-CM | POA: Diagnosis present

## 2018-01-19 DIAGNOSIS — O4102X0 Oligohydramnios, second trimester, not applicable or unspecified: Secondary | ICD-10-CM | POA: Diagnosis present

## 2018-01-19 DIAGNOSIS — D649 Anemia, unspecified: Secondary | ICD-10-CM | POA: Diagnosis present

## 2018-01-19 DIAGNOSIS — O9902 Anemia complicating childbirth: Secondary | ICD-10-CM | POA: Diagnosis not present

## 2018-01-19 DIAGNOSIS — O209 Hemorrhage in early pregnancy, unspecified: Secondary | ICD-10-CM | POA: Diagnosis present

## 2018-01-19 HISTORY — DX: Anemia complicating childbirth: O99.02

## 2018-01-19 LAB — CBC
HCT: 34.6 % — ABNORMAL LOW (ref 36.0–46.0)
Hemoglobin: 11.6 g/dL — ABNORMAL LOW (ref 12.0–15.0)
MCH: 28.2 pg (ref 26.0–34.0)
MCHC: 33.5 g/dL (ref 30.0–36.0)
MCV: 84 fL (ref 78.0–100.0)
PLATELETS: 239 10*3/uL (ref 150–400)
RBC: 4.12 MIL/uL (ref 3.87–5.11)
RDW: 14.7 % (ref 11.5–15.5)
WBC: 9.3 10*3/uL (ref 4.0–10.5)

## 2018-01-19 LAB — APTT: aPTT: 30 seconds (ref 24–36)

## 2018-01-19 LAB — PROTIME-INR
INR: 1.05
Prothrombin Time: 13.6 seconds (ref 11.4–15.2)

## 2018-01-19 LAB — WET PREP, GENITAL
CLUE CELLS WET PREP: NONE SEEN
Sperm: NONE SEEN
Trich, Wet Prep: NONE SEEN
YEAST WET PREP: NONE SEEN

## 2018-01-19 LAB — TYPE AND SCREEN
ABO/RH(D): B POS
Antibody Screen: NEGATIVE

## 2018-01-19 LAB — FIBRINOGEN: FIBRINOGEN: 646 mg/dL — AB (ref 210–475)

## 2018-01-19 MED ORDER — ACETAMINOPHEN 325 MG PO TABS
650.0000 mg | ORAL_TABLET | ORAL | Status: DC | PRN
  Administered 2018-01-21 (×3): 650 mg via ORAL
  Filled 2018-01-19 (×3): qty 2

## 2018-01-19 MED ORDER — BUTORPHANOL TARTRATE 1 MG/ML IJ SOLN
2.0000 mg | INTRAMUSCULAR | Status: DC | PRN
  Filled 2018-01-19 (×2): qty 2

## 2018-01-19 MED ORDER — PROMETHAZINE HCL 25 MG/ML IJ SOLN: 12.5000 mg | INTRAMUSCULAR | Status: DC | PRN

## 2018-01-19 MED ORDER — ZOLPIDEM TARTRATE 5 MG PO TABS: 5.0000 mg | ORAL_TABLET | Freq: Every evening | ORAL | Status: DC | PRN

## 2018-01-19 MED ORDER — CALCIUM CARBONATE ANTACID 500 MG PO CHEW: 2.0000 | CHEWABLE_TABLET | ORAL | Status: DC | PRN

## 2018-01-19 MED ORDER — DOCUSATE SODIUM 100 MG PO CAPS
100.0000 mg | ORAL_CAPSULE | Freq: Every day | ORAL | Status: DC
  Administered 2018-01-20: 100 mg via ORAL
  Filled 2018-01-19 (×2): qty 1

## 2018-01-19 MED ORDER — LACTATED RINGERS IV BOLUS
1000.0000 mL | Freq: Once | INTRAVENOUS | Status: AC
  Administered 2018-01-19: 1000 mL via INTRAVENOUS

## 2018-01-19 NOTE — MAU Note (Signed)
Pt presents with c/o VB that began @ 1615 this afternoon.  Pt states she walking and had a large gush of bright red VB.  Currently denies abdominal pain.  Reports seen @ [facility] yesterday for abdominal pain, FHT auscultated there & present.

## 2018-01-19 NOTE — Progress Notes (Signed)
Patient Dawn Norman is a 27 y.o. G1P0000 at [redacted]w[redacted]d who presented emergently to [facility]U via EMS with active frank red bleeding. Stated she was treated for same abdominal cramping and had FHT confirmed by [facility] yesterday. Plans to continue prenatal care with that practice. Orders placed to initiate triage while [facility] CNM is notified of patient arrival.  [id number] upon arrival in MAU  Patient Vitals for the past 24 hrs:  BP Temp Temp src Pulse Resp SpO2  01/19/18 1745 - - - - - 99 %  01/19/18 1730 - - - - - 100 %  01/19/18 1727 - - - - - 100 %  01/19/18 1715 - - - - - 100 %  01/19/18 1700 - - - - - 100 %  01/19/18 1651 136/87 99.1 F (37.3 C) Oral 91 20 100 %    Review of Systems  Respiratory: Negative for shortness of breath.   Gastrointestinal: Negative for abdominal pain, constipation and vomiting.  Neurological: Negative for dizziness, seizures, loss of consciousness, weakness and headaches.    Physical Exam  Constitutional: She is oriented to person, place, and time. She appears well-developed and well-nourished.  HENT:  Head: Normocephalic.  Cardiovascular: Normal rate, regular rhythm, normal heart sounds and intact distal pulses.  Pulmonary/Chest: Effort normal and breath sounds normal. No respiratory distress.  Abdominal:  Gravid  Genitourinary: Uterus normal. Cervix exhibits no motion tenderness. Right adnexum displays no mass, no tenderness and no fullness. Left adnexum displays no mass, no tenderness and no fullness. There is bleeding in the vagina. No erythema or tenderness in the vagina. No foreign body in the vagina. No signs of injury around the vagina.  Genitourinary Comments: Dark red sanguinous discharge visible on speculum exam No new bleeding visualized after fox swabs applied  Negative CMT   Neurological: She is alert and oriented to person, place, and time.  Skin: Skin is warm and dry. Capillary refill takes less than 2 seconds.  Psychiatric: She has a normal  mood and affect. Her behavior is normal. Judgment and thought content normal.  Nursing note and vitals reviewed.  Orders Placed This Encounter  Procedures  . Wet prep, genital  . [place] MFM OB Limited  . CBC   [facility] contacted by RN. CNMs covering practice being contacted by [employer].  Mallie Snooks, [place] 01/19/18  5:28 PM

## 2018-01-19 NOTE — H&P (Addendum)
Chief Complaint  Patient presents with  . Vaginal Bleeding   HPI   Dawn Norman is a 27 y.o. G1P0000 at [redacted]w[redacted]d per unsure LMP. Patient's last menstrual period was 09/28/2017 (approximate).  She was seen at [facility] yesterday as new patient, work in visit for c/o RLQ pain since Tuesday. Pain was mild and continuous, worse with movement and suspected RLP. No s/sx of PTL at that time, no palp ctx, no VB or LOF, + FHT. Patient was pending NOB visit on 01/24/18 and initiating prenatal care. Today patient reports sudden gush of bloody fluid at 1615, no pain or ctx, active bleeding noted at that time and patient called EMS for transport to MAU. Triaged in MAU by attending provider and noted stable on arrival, minimal bleeding per vagina and closed cervix. [place], CBC and GC/CT ordered.   OB History    Gravida  1   Para  0   Term  0   Preterm  0   AB  0   Living  0     SAB  0   TAB  0   Ectopic  0   Multiple  0   Live Births              Past Medical History:  Diagnosis Date  . Numbness of fingers   . Paresthesia   . Vitamin D deficiency     Past Surgical History:  Procedure Laterality Date  . EXTERNAL EAR SURGERY      Family History  Problem Relation Age of Onset  . Asthma Sister   . Thyroid disease Maternal Grandmother     Social History   Tobacco Use  . Smoking status: Never Smoker  . Smokeless tobacco: Never Used  Substance Use Topics  . Alcohol use: Yes    Alcohol/week: 1.2 oz    Types: 2 Glasses of wine per week    Comment: social  . Drug use: No    Allergies: No Known Allergies  Medications Prior to Admission  Medication Sig Dispense Refill Last Dose  . acetaminophen (TYLENOL) 500 MG tablet Take 1,000 mg by mouth every 6 (six) hours as needed for mild pain.   01/19/2018 at Unknown time  . Prenatal MV & Min w/FA-DHA (PRENATAL ADULT GUMMY/DHA/FA PO) Take 2 each by mouth daily.   Past Week at Unknown time    ROS Physical Exam     Physical Exam: General: NAD,  AAO x 3, appropriate grieving Heart: RRR, no murmurs Lungs: CTA b/l  Abd: Soft, NT  Ext: no edema Neuro: DTRs normal Other: GU: SSE with small clot at cervical os, fetal parts and membrane visible at os, no active bleeding noted. Patient declines further FHT assessment at this time.  No palpable ctx.   Patient Vitals for the past 24 hrs:  BP Temp Temp src Pulse Resp SpO2  01/19/18 1825 - - - - - 100 %  01/19/18 1823 138/72 - - 81 - -  01/19/18 1745 - - - - - 99 %  01/19/18 1730 - - - - - 100 %  01/19/18 1727 - - - - - 100 %  01/19/18 1715 - - - - - 100 %  01/19/18 1700 - - - - - 100 %  01/19/18 1651 136/87 99.1 F (37.3 C) Oral 91 20 100 %    ED Course  Procedures  CBC    Component Value Date/Time   WBC 9.3 01/19/2018 1833  RBC 4.12 01/19/2018 1833   HGB 11.6 (L) 01/19/2018 1833   HCT 34.6 (L) 01/19/2018 1833   PLT 239 01/19/2018 1833   MCV 84.0 01/19/2018 1833   MCH 28.2 01/19/2018 1833   MCHC 33.5 01/19/2018 1833   RDW 14.7 01/19/2018 1833   LYMPHSABS 2,623 11/15/2016 1631   MONOABS 549 11/15/2016 1631   EOSABS 305 11/15/2016 1631   BASOSABS 61 11/15/2016 1631   PT 13.6, INR 1.05, PTT 30, fibrinogen 646  Sono findings preliminary - anhydramnious, vertex, IUP at 16wks, + FHT 170  IV fluids 125 CC/hr LR infusing  A/P: G1 at [redacted]w[redacted]d Gross SROM, anhydramnious per ultrasound + FHT NIL Hemodynamically stable, no evidence of chorio TORCH panel, T&S, GC/CT, UDS pending  Discussed findings with patient and spouse. Poor prognosis for survivability with PPROM midterm, remote from viability. Possibility of spontaneous labor within next 24 hours high and attempts to prolong pregnancy are not advisable as risk for infection significant.  Discussed options for management  - expectant management outpatient with possibility of spontaneous labor in next 24 hours likely, latency may be prolonged to 10-14 days though. Per UpToDate,  antibiotic regimen not indicated in absence of s/sx of chorio.  - medical management with oral misoprostol inpatient, labor and vaginal delivery of possible live fetus within next 24-72 hrs with options for pain management.  Risk of retained placenta after vaginal delivery and need for surgical management addressed.  - surgical management with D&E  After time in counsel, patient and her spouse opting for surgical management if not delivered by morning.  Plan discussed with Dr. Ronita Hipps Management discussed with patient. Consult note dictated. Will admit for observation tonight, NPO after midnight, prep for OR case at 10 AM, house supervisor notified.    Juliene Pina, CNM, MSN 01/19/2018, 8:09 PM

## 2018-01-20 ENCOUNTER — Encounter (HOSPITAL_COMMUNITY): Admission: AD | Disposition: A | Discharge status: Home / Self Care | Payer: Self-pay | Source: Home / Self Care

## 2018-01-20 ENCOUNTER — Encounter (HOSPITAL_COMMUNITY): Payer: Self-pay | Admitting: Anesthesiology

## 2018-01-20 LAB — ABO/RH: ABO/RH(D): B POS

## 2018-01-20 SURGERY — DILATION AND EVACUATION, UTERUS
Anesthesia: Choice

## 2018-01-20 NOTE — Consult Note (Addendum)
NAME: Dawn Norman, CRUME MEDICAL RECORD BS:[id number] ACCOUNT [id number] DATE OF BIRTH:02-17-1991 FACILITY: [facility] LOCATION: [id number] PHYSICIAN:Jamarquis Crull J. Ronita Hipps, MD  CONSULTATION  DATE OF ADMISSION:  01/20/2018  REASON FOR CONSULTATION:  Ruptured membranes with anhydramnios and fetal parts at the cervical os at 16 weeks and 1 day.  HISTORY OF PRESENT ILLNESS AND CONSULTATION NOTE:  She is a 27 year old African-American female G1, P0 who presented with a complaint of a sudden gush of bloody fluid on 01/19/2018 at 4:30 p.m.   Upon admission it was noted that fetal heart tones were  noted.  Ultrasound confirmed fetal anhydramnios and speculum exam revealed cervical dilatation with fetal parts seen at the os, partially projecting into the vagina.  At that time, the patient was counseled for the extremely poor prognosis and the  situation with no amniotic fluid and previable status.  She was counseled about expectant management versus proceeding with more aggressive management in the form of termination of an otherwise previable pregnancy.  Upon surgical scheduling, it was  brought to my attention by hospital house coverage and Chief of Service that such a procedure would not be allowed due to the consideration that it was "elective" in nature.  A 72-hour counseling period based on the interpretation of the state law was  invoked at this time.  This situation was discussed with the patient.  Expectant management at this time is her only option.  Risks of infection and bleeding with secondary maternal risks pursuant to expectant management have been discussed.  The patient acknowledges  these risks, understands the interpretations that have been provided to [place] by the Chief of Staff and will proceed as determined.  She is considering inpatient versus outpatient management at this time.  Currently, patient is stable with no evidence of heavy bleeding, no signs and symptoms of chorioamnionitis. Fetal heart  tones are present.  PN/NUANCE D:01/20/2018 T:01/20/2018 JOB:[id number]

## 2018-01-20 NOTE — Progress Notes (Signed)
HD#1  PPROM at [redacted]w[redacted]d   Subjective: Patient reports minimal bleeding with voids, no problems voiding.  Some cramping noted. Rested overnight. Family present at [facility] and supportive.   Objective: I have reviewed patient's vital signs and labs. Vitals:   01/19/18 2353 01/20/18 0440 01/20/18 0806 01/20/18 1154  BP: 124/77 115/67 (!) 104/57 (!) 101/55  Pulse: 92 85 81 80  Resp: 17 16 18 18   Temp: 98.2 F (36.8 C) 98.5 F (36.9 C) 98.5 F (36.9 C) 98.5 F (36.9 C)  TempSrc: Oral Oral Oral Oral  SpO2: 100% 99% 100% 100%  Weight:      Height:       CBC    Component Value Date/Time   WBC 9.3 01/19/2018 1833   RBC 4.12 01/19/2018 1833   HGB 11.6 (L) 01/19/2018 1833   HCT 34.6 (L) 01/19/2018 1833   PLT 239 01/19/2018 1833   MCV 84.0 01/19/2018 1833   MCH 28.2 01/19/2018 1833   MCHC 33.5 01/19/2018 1833   RDW 14.7 01/19/2018 1833   LYMPHSABS 2,623 11/15/2016 1631   MONOABS 549 11/15/2016 1631   EOSABS 305 11/15/2016 1631   BASOSABS 61 37/10/8887 1694   Conflict (See Lab Report): B POS/B POS Performed at [facility], [address], [place], [place] [address]  General: alert, cooperative and no distress Resp: clear to auscultation bilaterally Cardio: regular rate and rhythm GI: normal findings: bowel sounds normal and gravid, mild tendernes over RLQ with palpation, no guarding Extremities: no edema, redness or tenderness in the calves or thighs Vaginal Bleeding: minimal FHT 140's per RN this AM, no UC palp Vaginal exam deferred  Assessment/Plan: [redacted]w[redacted]d G1P0000  Patient Active Problem List   Diagnosis Date Noted  . Anhydramnios in second trimester 01/19/2018  . PPROM (premature preterm rupture of membranes) 01/19/2018  Desires surgical management of inevitable miscarriage Afebrile, no overt s/sx of chorio though somewhat tender abdominal exam Repeat CBC with if febrile signs No sx of spont labor.   Per hospital policy, need for 72 hr wait before proceeding with  "elective " termination discussed with patient and family.  Expectant management in interim, patient offered outpatient or inpatient management and opts for inpatient after review of posible complications of out of hospital birth and vaginal bleeding/retained placenta.   FHT's at patient's discretion Offered chaplin/counseling and declines   Dr. Ronita Hipps at [facility] for consult, all questions answered.    LOS: 1 day    Juliene Pina 01/20/2018, 11:47 AM

## 2018-01-21 LAB — CBC WITH DIFFERENTIAL/PLATELET
Basophils Absolute: 0 K/uL (ref 0.0–0.1)
Basophils Relative: 0 %
Eosinophils Absolute: 0 K/uL (ref 0.0–0.7)
Eosinophils Relative: 0 %
HCT: 33.9 % — ABNORMAL LOW (ref 36.0–46.0)
Hemoglobin: 11.5 g/dL — ABNORMAL LOW (ref 12.0–15.0)
Lymphocytes Relative: 6 %
Lymphs Abs: 0.9 K/uL (ref 0.7–4.0)
MCH: 28.3 pg (ref 26.0–34.0)
MCHC: 33.9 g/dL (ref 30.0–36.0)
MCV: 83.5 fL (ref 78.0–100.0)
Monocytes Absolute: 0.8 K/uL (ref 0.1–1.0)
Monocytes Relative: 5 %
Neutro Abs: 14.6 K/uL — ABNORMAL HIGH (ref 1.7–7.7)
Neutrophils Relative %: 89 %
Platelets: 219 K/uL (ref 150–400)
RBC: 4.06 MIL/uL (ref 3.87–5.11)
RDW: 14.6 % (ref 11.5–15.5)
WBC: 16.3 K/uL — ABNORMAL HIGH (ref 4.0–10.5)

## 2018-01-21 LAB — COMPREHENSIVE METABOLIC PANEL
ALBUMIN: 3.2 g/dL — AB (ref 3.5–5.0)
ALT: 10 U/L (ref 0–44)
AST: 14 U/L — AB (ref 15–41)
Alkaline Phosphatase: 57 U/L (ref 38–126)
Anion gap: 10 (ref 5–15)
BILIRUBIN TOTAL: 0.6 mg/dL (ref 0.3–1.2)
BUN: 6 mg/dL (ref 6–20)
CHLORIDE: 98 mmol/L (ref 98–111)
CO2: 21 mmol/L — AB (ref 22–32)
Calcium: 8.8 mg/dL — ABNORMAL LOW (ref 8.9–10.3)
Creatinine, Ser: 0.74 mg/dL (ref 0.44–1.00)
GFR calc Af Amer: >60 mL/min (ref >60)
GFR calc non Af Amer: >60 mL/min (ref >60)
GLUCOSE: 105 mg/dL — AB (ref 70–99)
POTASSIUM: 3.4 mmol/L — AB (ref 3.5–5.1)
SODIUM: 129 mmol/L — AB (ref 135–145)
Total Protein: 7.3 g/dL (ref 6.5–8.1)

## 2018-01-21 LAB — PROTIME-INR
INR: 1.09
Prothrombin Time: 14 seconds (ref 11.4–15.2)

## 2018-01-21 LAB — FIBRINOGEN: Fibrinogen: 643 mg/dL — ABNORMAL HIGH (ref 210–475)

## 2018-01-21 LAB — APTT: aPTT: 30 seconds (ref 24–36)

## 2018-01-21 LAB — D-DIMER, QUANTITATIVE (NOT AT ARMC): D DIMER QUANT: 1.29 ug{FEU}/mL — AB (ref 0.00–0.50)

## 2018-01-21 MED ORDER — FENTANYL 40 MCG/ML IV SOLN
INTRAVENOUS | Status: DC
  Administered 2018-01-21: 1000 ug via INTRAVENOUS
  Filled 2018-01-21: qty 25

## 2018-01-21 MED ORDER — DIBUCAINE 1 % RE OINT: 1.0000 "application " | TOPICAL_OINTMENT | RECTAL | Status: DC | PRN

## 2018-01-21 MED ORDER — MISOPROSTOL 200 MCG PO TABS
600.0000 ug | ORAL_TABLET | Freq: Four times a day (QID) | ORAL | Status: DC
  Administered 2018-01-21: 600 ug via BUCCAL
  Filled 2018-01-21: qty 3

## 2018-01-21 MED ORDER — RISAQUAD PO CAPS
1.0000 | ORAL_CAPSULE | Freq: Every day | ORAL | Status: DC
  Filled 2018-01-21 (×2): qty 1

## 2018-01-21 MED ORDER — MISOPROSTOL 200 MCG PO TABS: 400.0000 ug | ORAL_TABLET | Freq: Four times a day (QID) | ORAL | Status: DC

## 2018-01-21 MED ORDER — GENTAMICIN SULFATE 40 MG/ML IJ SOLN
5.0000 mg/kg | INTRAMUSCULAR | Status: DC
  Administered 2018-01-21: 400 mg via INTRAVENOUS
  Filled 2018-01-21 (×2): qty 10

## 2018-01-21 MED ORDER — ONDANSETRON HCL 4 MG/2ML IJ SOLN: 4.0000 mg | Freq: Four times a day (QID) | INTRAMUSCULAR | Status: DC | PRN

## 2018-01-21 MED ORDER — BISACODYL 10 MG RE SUPP: 10.0000 mg | Freq: Every day | RECTAL | Status: DC | PRN

## 2018-01-21 MED ORDER — PROMETHAZINE HCL 25 MG/ML IJ SOLN: 25.0000 mg | INTRAMUSCULAR | Status: DC | PRN

## 2018-01-21 MED ORDER — LACTATED RINGERS IV SOLN
INTRAVENOUS | Status: DC
  Administered 2018-01-21 (×2): via INTRAVENOUS

## 2018-01-21 MED ORDER — NALOXONE HCL 0.4 MG/ML IJ SOLN: 0.4000 mg | INTRAMUSCULAR | Status: DC | PRN

## 2018-01-21 MED ORDER — IBUPROFEN 600 MG PO TABS
600.0000 mg | ORAL_TABLET | Freq: Four times a day (QID) | ORAL | Status: DC
  Administered 2018-01-21 – 2018-01-22 (×2): 600 mg via ORAL
  Filled 2018-01-21 (×2): qty 1

## 2018-01-21 MED ORDER — CLINDAMYCIN PHOSPHATE 900 MG/50ML IV SOLN
900.0000 mg | Freq: Three times a day (TID) | INTRAVENOUS | Status: DC
  Administered 2018-01-21 – 2018-01-22 (×3): 900 mg via INTRAVENOUS
  Filled 2018-01-21 (×4): qty 50

## 2018-01-21 MED ORDER — WITCH HAZEL-GLYCERIN EX PADS: 1.0000 "application " | MEDICATED_PAD | CUTANEOUS | Status: DC | PRN

## 2018-01-21 MED ORDER — PRENATAL MULTIVITAMIN CH: 1.0000 | ORAL_TABLET | Freq: Every day | ORAL | Status: DC

## 2018-01-21 MED ORDER — DIPHENHYDRAMINE HCL 25 MG PO CAPS: 25.0000 mg | ORAL_CAPSULE | Freq: Four times a day (QID) | ORAL | Status: DC | PRN

## 2018-01-21 MED ORDER — AMPICILLIN SODIUM 2 G IJ SOLR
2.0000 g | Freq: Four times a day (QID) | INTRAMUSCULAR | Status: DC
  Administered 2018-01-21 – 2018-01-22 (×4): 2 g via INTRAVENOUS
  Filled 2018-01-21: qty 2
  Filled 2018-01-21: qty 2000
  Filled 2018-01-21: qty 2
  Filled 2018-01-21: qty 2000
  Filled 2018-01-21: qty 2

## 2018-01-21 MED ORDER — SODIUM CHLORIDE 0.9% FLUSH: 9.0000 mL | INTRAVENOUS | Status: DC | PRN

## 2018-01-21 MED ORDER — BENZOCAINE-MENTHOL 20-0.5 % EX AERO: 1.0000 "application " | INHALATION_SPRAY | CUTANEOUS | Status: DC | PRN

## 2018-01-21 MED ORDER — OXYCODONE HCL 5 MG PO TABS
5.0000 mg | ORAL_TABLET | ORAL | Status: DC | PRN
  Administered 2018-01-21: 5 mg via ORAL
  Filled 2018-01-21: qty 1

## 2018-01-21 MED ORDER — DIPHENHYDRAMINE HCL 50 MG/ML IJ SOLN: 12.5000 mg | Freq: Four times a day (QID) | INTRAMUSCULAR | Status: DC | PRN

## 2018-01-21 MED ORDER — IBUPROFEN 800 MG PO TABS
800.0000 mg | ORAL_TABLET | ORAL | Status: AC
  Administered 2018-01-21: 800 mg via ORAL
  Filled 2018-01-21: qty 1

## 2018-01-21 MED ORDER — FLEET ENEMA 7-19 GM/118ML RE ENEM: 1.0000 | ENEMA | Freq: Every day | RECTAL | Status: DC | PRN

## 2018-01-21 MED ORDER — DIPHENHYDRAMINE HCL 12.5 MG/5ML PO ELIX: 12.5000 mg | ORAL_SOLUTION | Freq: Four times a day (QID) | ORAL | Status: DC | PRN

## 2018-01-21 MED ORDER — SIMETHICONE 80 MG PO CHEW: 80.0000 mg | CHEWABLE_TABLET | ORAL | Status: DC | PRN

## 2018-01-21 NOTE — Progress Notes (Signed)
S:  Reports feeling "crampy" every 10 minutes  O:  VS: BP 111/62 (BP Location: Right Arm)   Pulse (!) 115   Temp 98.7 F (37.1 C) (Oral)   Resp 18          Bedside U/S-no fetal tone and no cardiac activity noted General: AAO x3 Abdomen: significant tenderness GU: vaginal bleeding Cervix: 2/long/high w/ umbilical cord prolapsed in vaginal vault Membranes: Malodorous fluid CBC    Component Value Date/Time   WBC 16.3 (H) 01/21/2018 1135   RBC 4.06 01/21/2018 1135   HGB 11.5 (L) 01/21/2018 1135   HCT 33.9 (L) 01/21/2018 1135   PLT 219 01/21/2018 1135   MCV 83.5 01/21/2018 1135   MCH 28.3 01/21/2018 1135   MCHC 33.9 01/21/2018 1135   RDW 14.6 01/21/2018 1135   LYMPHSABS 0.9 01/21/2018 1135   MONOABS 0.8 01/21/2018 1135   EOSABS 0.0 01/21/2018 1135   BASOSABS 0.0 01/21/2018 1135    A: Early labor IUFD Suspect chorio -elevated WBC and neutrophils   P: Misoprostol 600 mcg SL Q 6 hours Tylenol 1G Q 6 hours for fever Fentanyl PCA for pain mngt Amp 2G Q 6 hours and Gent 5mg /kg Q 24 hours per chorio protocol Will add Clindamycin 900 mg IV Q 8 hours for gram neg coverage  CMP, and coags pending Anticipate SVD Pt counseled on expectant mngt for fetal demise Plan of care in consult w/ Dr. Orie Fisherman  01/21/2018, 11:30 AM

## 2018-01-21 NOTE — Progress Notes (Signed)
54ml Fentayl wasted to sink with Gerline Legacy, RN

## 2018-01-21 NOTE — Procedures (Addendum)
PPROM at [redacted]w[redacted]d and + FHT on admit Required 72 hours hold Expectant management until IUFD at some time during the night, then IOL initiated with buccal cytotec 600 mcg. Patient already experiencing mild cramping in AM, with low grade temp, abdominal tenderness, and foul odor discharge, bleeding minimal. In addition, increased WBC from initial admit with left shift indicative of chorioamnionitis setting in. Patient started on triple antibiotics therapy and PCA Fentanyl for pain.   At 1410 spontaneous delivery of fetus occurred with CNM assistance, along with several small clots. No signs of life and fetus appears ~[redacted] wks GA. Umbilical cord clamped and cut, and body passed to RN.  Placenta delivered spontaneous and complete at 1417. Minimal bleeding afterwards and fundus firm on palpation. No perineal or vaginal tears.  Fetus appears female. Wrapped in blanket and viewed by mother and held by father.   Placenta to pathology.  Parents decline all fetal testing.   Will DC IV fentanyl, continue inpatient Antibiotic regimen x 24 hrs.   Bereavement support for parents.   Patient to remain on Antibiotics until AM due to chorioamnionitis related to prolonged previable PROM with nonintervention as previously documented.. Pt grieving appropriately.  Juliene Pina, MSN, CNM 01/21/2018, 3:13 PM

## 2018-01-21 NOTE — Progress Notes (Signed)
Delivery of fetus 27 Delivery of placenta 1417 (Intact)

## 2018-01-22 ENCOUNTER — Encounter (HOSPITAL_COMMUNITY): Payer: Self-pay

## 2018-01-22 DIAGNOSIS — O9902 Anemia complicating childbirth: Secondary | ICD-10-CM

## 2018-01-22 DIAGNOSIS — O411220 Chorioamnionitis, second trimester, not applicable or unspecified: Secondary | ICD-10-CM | POA: Clinically undetermined

## 2018-01-22 HISTORY — DX: Anemia complicating childbirth: O99.02

## 2018-01-22 LAB — CBC
HCT: 27.1 % — ABNORMAL LOW (ref 36.0–46.0)
Hemoglobin: 9.4 g/dL — ABNORMAL LOW (ref 12.0–15.0)
MCH: 28.8 pg (ref 26.0–34.0)
MCHC: 34.7 g/dL (ref 30.0–36.0)
MCV: 83.1 fL (ref 78.0–100.0)
PLATELETS: 199 10*3/uL (ref 150–400)
RBC: 3.26 MIL/uL — AB (ref 3.87–5.11)
RDW: 14.8 % (ref 11.5–15.5)
WBC: 17 10*3/uL — AB (ref 4.0–10.5)

## 2018-01-22 LAB — DRUG PROFILE, UR, 9 DRUGS (LABCORP)
Amphetamines, Urine: NEGATIVE ng/mL
BARBITURATE, UR: NEGATIVE ng/mL
Benzodiazepine Quant, Ur: NEGATIVE ng/mL
COCAINE (METAB.): NEGATIVE ng/mL
Cannabinoid Quant, Ur: NEGATIVE ng/mL
METHADONE SCREEN, URINE: NEGATIVE ng/mL
OPIATE QUANT UR: NEGATIVE ng/mL
PHENCYCLIDINE, UR: NEGATIVE ng/mL
PROPOXYPHENE, URINE: NEGATIVE ng/mL

## 2018-01-22 LAB — GC/CHLAMYDIA PROBE AMP (~~LOC~~) NOT AT ARMC
Chlamydia: NEGATIVE
Neisseria Gonorrhea: NEGATIVE

## 2018-01-22 MED ORDER — MAGNESIUM OXIDE 400 (241.3 MG) MG PO TABS
400.0000 mg | ORAL_TABLET | Freq: Every day | ORAL | Status: DC
  Filled 2018-01-22: qty 1

## 2018-01-22 MED ORDER — MAGNESIUM OXIDE 400 (241.3 MG) MG PO TABS: 400.0000 mg | ORAL_TABLET | Freq: Every day | ORAL | 1 refills | Status: DC

## 2018-01-22 MED ORDER — RISAQUAD PO CAPS: 1.0000 | ORAL_CAPSULE | Freq: Every day | ORAL | Status: DC

## 2018-01-22 MED ORDER — POLYSACCHARIDE IRON COMPLEX 150 MG PO CAPS: 150.0000 mg | ORAL_CAPSULE | Freq: Every day | ORAL | 1 refills | Status: DC

## 2018-01-22 MED ORDER — POLYSACCHARIDE IRON COMPLEX 150 MG PO CAPS
150.0000 mg | ORAL_CAPSULE | Freq: Every day | ORAL | Status: DC
  Filled 2018-01-22: qty 1

## 2018-01-22 MED ORDER — IBUPROFEN 600 MG PO TABS: 600.0000 mg | ORAL_TABLET | Freq: Four times a day (QID) | ORAL | 0 refills | Status: DC

## 2018-01-22 NOTE — Progress Notes (Addendum)
Patient ID: Temple Pacini, female   DOB: 10-05-1990, 27 y.o.   MRN: [id number]  Subjective: PPD# 1, PPROM, Chorio, delivered  Reports feeling well this morning, ready for discharge home. Patient reports tolerating PO.  Breast symptoms: none Pain controlled with ibuprofen (OTC) Denies HA/SOB/C/P/N/V/dizziness. Flatus present. She reports vaginal bleeding as minimal, without clots.  She is ambulating, urinating without difficulty.     Objective:   VS:    Vitals:   01/21/18 2313 01/22/18 0310 01/22/18 0341 01/22/18 0731  BP: 110/61 (!) 86/57 (!) 100/57 (!) 96/58  Pulse: 92 76 76 79  Resp: 17 18 16 18   Temp: 98.7 F (37.1 C) 97.6 F (36.4 C)  97.9 F (36.6 C)  TempSrc: Oral Oral  Oral  SpO2: 99% 97% 100% 99%  Weight:      Height:        No intake or output data in the 24 hours ending 01/22/18 0816      Recent Labs    01/21/18 1135 01/22/18 0523  WBC 16.3* 17.0*  HGB 11.5* 9.4*  HCT 33.9* 27.1*  PLT 219 199     Blood type: --/--/B POS, B POS Performed at [facility], [address], [place],  [address]  (06/28 2011)  Rubella:   Pending    Physical Exam:  General: alert, cooperative and no distress CV: Regular rate and rhythm Resp: clear Abdomen: soft, nontender, normal bowel sounds Uterine Fundus: firm, above SP, nontender Lochia: minimal Ext: extremities normal, atraumatic, no cyanosis or edema   Assessment/Plan: 27 y.o.   PPD# 1. G1P0010                  Active Problems:   Preterm labor with preterm delivery in second trimester, previable fetus   Chorioamnionitis in second trimester - resolving  - no evidenced of endometritis  - triple ABX therapy, will DC today. Per UpToDate, no recommendation for PO abx therapy outpatient needed  - afebrile since delivery and no further tenderness or chills    Maternal anemia, with delivery  - start oral Fe and Mag Ox  Doing well, stable.  Grieving appropriately, has good family support, Heartstring info  given for bereavement support Routine post-partum care DC home today with instructions, out of work x 1 week. Close F/U in 2 weeks at [facility] Pt seen and examined Agree with plan of care   Juliene Pina, CNM, MSN 01/22/2018, 8:16 AM

## 2018-01-22 NOTE — Discharge Instructions (Signed)
Call [facility] for any postpartum emergent concerns at [phone].

## 2018-01-22 NOTE — Discharge Summary (Addendum)
OB Discharge Summary     Patient Name: Dawn Norman DOB: [birth year] MRN: [id number]  Date of admission: 01/19/2018 Delivering MD: Derrell Lolling C   Date of discharge: 01/22/2018  Admitting diagnosis: 27 WKS, BLEEDING, PAIN Intrauterine pregnancy: [redacted]w[redacted]d     Secondary diagnosis:  Active Problems:   Preterm labor with preterm delivery in second trimester   Anhydramnios in second trimester   PROM (premature rupture of membranes)   Chorioamnionitis in second trimester   Maternal anemia, with delivery      Discharge diagnosis:  Patient Active Problem List   Diagnosis Date Noted  . Preterm labor with preterm delivery in second trimester 01/21/2018    Priority: Medium  . Chorioamnionitis in second trimester 01/22/2018  . Maternal anemia, with delivery 01/22/2018                               Augmentation: Cytotec  Complications: Intrauterine Inflammation or infection (Chorioamniotis)  Hospital course:  Onset of Labor With Vaginal Delivery     27 y.o. yo G1P0000 at [redacted]w[redacted]d was admitted with preterm premature rupture of membranes at previability with live fetus on 01/19/2018. Patient had a labor course complicated by latency, delayed active management due to 72 hour required hold in setting of live fetus, and presumed chorioamnionitis on hospital day #3, with tender abdomen on palpation, foul odor discharge, and elevated white blood cell count with left shift. No maternal temp registered but patient was exhibiting chills and felt hot to touch internally. Fetal heart tones absent on HD#3 and induction of labor was initiated with Cytotec 600 mcg buccally. Patient was also started on triple antibiotic intravenous regimen x 24 hours.Pain was managed via Fentanyl PCA. Patient had a delivery of a Non Viable fetus on 01/21/2018. Parents declined genetics and pathology testing of fetus, placenta was sent to pathology.    Patient had an uncomplicated postpartum course.  She is ambulating, tolerating  a regular diet, passing flatus, and urinating well. Patient is discharged home in stable condition on 01/22/18.   Physical exam  Vitals:   01/21/18 2313 01/22/18 0310 01/22/18 0341 01/22/18 0731  BP: 110/61 (!) 86/57 (!) 100/57 (!) 96/58  Pulse: 92 76 76 79  Resp: 17 18 16 18   Temp: 98.7 F (37.1 C) 97.6 F (36.4 C)  97.9 F (36.6 C)  TempSrc: Oral Oral  Oral  SpO2: 99% 97% 100% 99%  Weight:      Height:       General: alert, cooperative and no distress Lochia: appropriate Uterine Fundus: firm Incision: N/A DVT Evaluation: No cords or calf tenderness. No significant calf/ankle edema. Labs: Lab Results  Component Value Date   WBC 17.0 (H) 01/22/2018   HGB 9.4 (L) 01/22/2018   HCT 27.1 (L) 01/22/2018   MCV 83.1 01/22/2018   PLT 199 01/22/2018   CMP Latest Ref Rng & Units 01/21/2018  Glucose 70 - 99 mg/dL 105(H)  BUN 6 - 20 mg/dL 6  Creatinine 0.44 - 1.00 mg/dL 0.74  Sodium 135 - 145 mmol/L 129(L)  Potassium 3.5 - 5.1 mmol/L 3.4(L)  Chloride 98 - 111 mmol/L 98  CO2 22 - 32 mmol/L 21(L)  Calcium 8.9 - 10.3 mg/dL 8.8(L)  Total Protein 6.5 - 8.1 g/dL 7.3  Total Bilirubin 0.3 - 1.2 mg/dL 0.6  Alkaline Phos 38 - 126 U/L 57  AST 15 - 41 U/L 14(L)  ALT 0 - 44 U/L 10  Discharge instruction: per After Visit Summary and "Baby and Me Booklet".  After visit meds:  Allergies as of 01/22/2018   No Known Allergies     Medication List    TAKE these medications   acetaminophen 500 MG tablet Commonly known as:  TYLENOL Take 1,000 mg by mouth every 6 (six) hours as needed for mild pain.   acidophilus Caps capsule Take 1 capsule by mouth daily.   ibuprofen 600 MG tablet Commonly known as:  ADVIL,MOTRIN Take 1 tablet (600 mg total) by mouth every 6 (six) hours.   iron polysaccharides 150 MG capsule Commonly known as:  NIFEREX Take 1 capsule (150 mg total) by mouth daily.   magnesium oxide 400 (241.3 Mg) MG tablet Commonly known as:  MAG-OX Take 1 tablet (400 mg  total) by mouth daily.   PRENATAL ADULT GUMMY/DHA/FA PO Take 2 each by mouth daily.       Diet: routine diet  Activity: Advance as tolerated. Pelvic rest for 4 weeks.   Outpatient follow up:  Follow-up Information    Juliene Pina, CNM. Schedule an appointment as soon as possible for a visit in 2 week(s).   Specialty:  Obstetrics and Gynecology Contact information: [address] [place] [address] [phone]          Postpartum contraception: Condoms Patient declined fetal testing Will recommend Makena with next pregnancy  01/22/2018 Juliene Pina, CNM

## 2018-01-23 LAB — INFECT DISEASE AB IGM REFLEX 1

## 2018-01-23 LAB — TORCH-IGM(TOXO/ RUB/ CMV/ HSV) W TITER: HSVI/II Comb IgM: <0.91 Ratio (ref 0.00–0.90)

## 2018-06-25 ENCOUNTER — Encounter (HOSPITAL_COMMUNITY): Payer: Self-pay | Admitting: *Deleted

## 2018-06-25 ENCOUNTER — Other Ambulatory Visit: Payer: Self-pay

## 2018-06-25 ENCOUNTER — Inpatient Hospital Stay (HOSPITAL_COMMUNITY): Payer: BC Managed Care – PPO

## 2018-06-25 ENCOUNTER — Inpatient Hospital Stay (HOSPITAL_COMMUNITY)
Admission: AD | Admit: 2018-06-25 | Discharge: 2018-06-25 | Disposition: A | Discharge status: Home / Self Care | Payer: BC Managed Care – PPO | Source: Ambulatory Visit | Attending: Obstetrics and Gynecology | Admitting: Obstetrics and Gynecology

## 2018-06-25 DIAGNOSIS — O200 Threatened abortion: Secondary | ICD-10-CM | POA: Insufficient documentation

## 2018-06-25 DIAGNOSIS — Z3A01 Less than 8 weeks gestation of pregnancy: Secondary | ICD-10-CM | POA: Insufficient documentation

## 2018-06-25 LAB — HCG, QUANTITATIVE, PREGNANCY: hCG, Beta Chain, Quant, S: 228 m[IU]/mL — ABNORMAL HIGH (ref <5)

## 2018-06-25 NOTE — Discharge Instructions (Signed)
Vaginal Bleeding During Pregnancy, First Trimester A small amount of bleeding (spotting) from the vagina is not uncommon in early pregnancy. Sometimes the bleeding is normal and is not a problem, and sometimes it is a sign of something serious. Be sure to tell your doctor about any bleeding from your vagina right away. Follow these instructions at home:  Watch your condition for any changes.  Follow your doctor's instructions about how active you can be.  If you are on bed rest: ? You may need to stay in bed and only get up to use the bathroom. ? You may be allowed to do some activities. ? If you need help, make plans for someone to help you.  Write down: ? The number of pads you use each day. ? How often you change pads. ? How soaked (saturated) your pads are.  Do not use tampons.  Do not douche.  Do not have sex or orgasms until your doctor says it is okay.  If you pass any tissue from your vagina, save the tissue so you can show it to your doctor.  Only take medicines as told by your doctor.  Do not take aspirin because it can make you bleed.  Keep all follow-up visits as told by your doctor. Contact a doctor if:  You bleed bright red like period from your vagina.  You have cramps or any persistent pain  You have labor pains.  You have a fever that does not go away after you take medicine. Get help right away if:  You pass large clots or tissue from your vagina.  You feel light-headed or weak.  You pass out (faint). This information is not intended to replace advice given to you by your health care provider. Make sure you discuss any questions you have with your health care provider. Document Released: 11/25/2013 Document Revised: 12/17/2015 Document Reviewed: 03/18/2013 Elsevier Interactive Patient Education  [employer].

## 2018-06-25 NOTE — MAU Note (Signed)
Spotting for 24 hrs.  Hx of loss. On progesterone

## 2018-06-25 NOTE — MAU Note (Signed)
Presents with c/o VB that began yesterday, not passing clots.  Denies abdominal pain or cramping.

## 2018-06-25 NOTE — MAU Provider Note (Signed)
  History     CSN: [id number]  Arrival date and time: 06/25/18 1603 Provider on unit at 16:15pm   HPI  Pink spotting x 24 hours after sex No cramps or pain Hx pregnancy loss  Past Medical History:  Diagnosis Date  . Maternal anemia, with delivery 01/22/2018  . Numbness of fingers   . Paresthesia   . Vitamin D deficiency     Past Surgical History:  Procedure Laterality Date  . EXTERNAL EAR SURGERY      Family History  Problem Relation Age of Onset  . Asthma Sister   . Thyroid disease Maternal Grandmother     Social History   Tobacco Use  . Smoking status: Never Smoker  . Smokeless tobacco: Never Used  Substance Use Topics  . Alcohol use: Yes    Alcohol/week: 2.0 standard drinks    Types: 2 Glasses of wine per week    Comment: social  . Drug use: No    Allergies: No Known Allergies  Medications Prior to Admission  Medication Sig Dispense Refill Last Dose  . acetaminophen (TYLENOL) 500 MG tablet Take 1,000 mg by mouth every 6 (six) hours as needed for mild pain.   01/19/2018 at Unknown time  . acidophilus (RISAQUAD) CAPS capsule Take 1 capsule by mouth daily.     Marland Kitchen ibuprofen (ADVIL,MOTRIN) 600 MG tablet Take 1 tablet (600 mg total) by mouth every 6 (six) hours. 30 tablet 0   . iron polysaccharides (NIFEREX) 150 MG capsule Take 1 capsule (150 mg total) by mouth daily. 30 capsule 1   . magnesium oxide (MAG-OX) 400 (241.3 Mg) MG tablet Take 1 tablet (400 mg total) by mouth daily. 30 tablet 1   . Prenatal MV & Min w/FA-DHA (PRENATAL ADULT GUMMY/DHA/FA PO) Take 2 each by mouth daily.   Past Week at Unknown time    Review of Systems  Spotting pink only with wiping since sex Sunday AM Physical Exam   Last menstrual period 09/28/2017.  Physical Exam  MAU Course  Procedures CLINICAL DATA:  Threatened abortion, first trimester. Vaginal bleeding.  EXAM: OBSTETRIC <14 WK [place] AND TRANSVAGINAL OB US  TECHNIQUE: Both transabdominal and transvaginal ultrasound  examinations were performed for complete evaluation of the gestation as well as the maternal uterus, adnexal regions, and pelvic cul-de-sac. Transvaginal technique was performed to assess early pregnancy.  COMPARISON:  None.  FINDINGS: Intrauterine gestational sac: None  Maternal uterus/adnexae: The ovaries are normal in appearance.  Two uterine fibroids are noted measuring 6.9 and 5.8 cm. The endometrium is thickened and heterogeneous in appearance. No intrauterine pregnancy. No free fluid in the pelvis.  IMPRESSION: 1. No intrauterine pregnancy identified. In the setting of a positive pregnancy test, this could represent ectopic pregnancy, early pregnancy, or recent miscarriage. Recommend clinical correlation and close follow-up.  2. Two fibroids seen in the uterus.   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

## 2018-06-26 LAB — PROGESTERONE: Progesterone: 6.8 ng/mL

## 2018-06-29 ENCOUNTER — Inpatient Hospital Stay (HOSPITAL_COMMUNITY)
Admission: AD | Admit: 2018-06-29 | Discharge: 2018-06-29 | Disposition: A | Discharge status: Home / Self Care | Payer: BC Managed Care – PPO | Source: Ambulatory Visit | Attending: Obstetrics and Gynecology | Admitting: Obstetrics and Gynecology

## 2018-06-29 DIAGNOSIS — O039 Complete or unspecified spontaneous abortion without complication: Secondary | ICD-10-CM | POA: Diagnosis present

## 2018-06-29 LAB — CREATININE, SERUM
CREATININE: 0.66 mg/dL (ref 0.44–1.00)
GFR calc Af Amer: >60 mL/min (ref >60)

## 2018-06-29 LAB — BUN: BUN: 10 mg/dL (ref 6–20)

## 2018-06-29 LAB — AST: AST: 15 U/L (ref 15–41)

## 2018-06-29 LAB — HCG, QUANTITATIVE, PREGNANCY: hCG, Beta Chain, Quant, S: 96 m[IU]/mL — ABNORMAL HIGH (ref <5)

## 2018-06-29 MED ORDER — METHOTREXATE INJECTION FOR WOMEN'S HOSPITAL: 50.0000 mg/m2 | Freq: Once | INTRAMUSCULAR | Status: DC

## 2018-06-29 NOTE — MAU Note (Signed)
D.Paul,CNM talked with patient on phone and gave her there results and follow up plan of care. Gave pt discharge instructions on miscarriage.Marland Kitchen

## 2018-06-29 NOTE — MAU Note (Signed)
Pt sent from office for MTX. For ectopic preg

## 2019-05-12 ENCOUNTER — Encounter (HOSPITAL_COMMUNITY): Payer: Self-pay

## 2019-07-20 ENCOUNTER — Emergency Department (HOSPITAL_COMMUNITY)
Admission: EM | Admit: 2019-07-20 | Discharge: 2019-07-20 | Disposition: A | Discharge status: Home / Self Care | Payer: BC Managed Care – PPO | Attending: Emergency Medicine | Admitting: Emergency Medicine

## 2019-07-20 ENCOUNTER — Encounter (HOSPITAL_COMMUNITY): Payer: Self-pay | Admitting: Emergency Medicine

## 2019-07-20 ENCOUNTER — Other Ambulatory Visit: Payer: Self-pay

## 2019-07-20 DIAGNOSIS — Y9389 Activity, other specified: Secondary | ICD-10-CM | POA: Insufficient documentation

## 2019-07-20 DIAGNOSIS — G44319 Acute post-traumatic headache, not intractable: Secondary | ICD-10-CM | POA: Diagnosis not present

## 2019-07-20 DIAGNOSIS — Z79899 Other long term (current) drug therapy: Secondary | ICD-10-CM | POA: Diagnosis not present

## 2019-07-20 DIAGNOSIS — Y92410 Unspecified street and highway as the place of occurrence of the external cause: Secondary | ICD-10-CM | POA: Insufficient documentation

## 2019-07-20 DIAGNOSIS — Y999 Unspecified external cause status: Secondary | ICD-10-CM | POA: Diagnosis not present

## 2019-07-20 DIAGNOSIS — S40022A Contusion of left upper arm, initial encounter: Secondary | ICD-10-CM | POA: Diagnosis not present

## 2019-07-20 DIAGNOSIS — S4992XA Unspecified injury of left shoulder and upper arm, initial encounter: Secondary | ICD-10-CM | POA: Diagnosis present

## 2019-07-20 MED ORDER — NAPROXEN 500 MG PO TABS
500.0000 mg | ORAL_TABLET | Freq: Once | ORAL | Status: AC
  Administered 2019-07-20: 22:00:00 500 mg via ORAL
  Filled 2019-07-20: qty 1

## 2019-07-20 MED ORDER — CYCLOBENZAPRINE HCL 10 MG PO TABS: 10.0000 mg | ORAL_TABLET | Freq: Two times a day (BID) | ORAL | 0 refills | Status: DC | PRN

## 2019-07-20 MED ORDER — NAPROXEN 375 MG PO TABS: 375.0000 mg | ORAL_TABLET | Freq: Two times a day (BID) | ORAL | 0 refills | Status: DC

## 2019-07-20 NOTE — Discharge Instructions (Addendum)
Take the medications as needed for pain and discomfort.  Expect to be stiff and sore for the next few days.  Return to the ER for severe headache or abdominal pain,, vomiting, or other concerning symptoms.
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
## 2020-07-24 IMAGING — US US OB < 14 WEEKS - US OB TV
1 series · 15 of 28 positions shown · non-contrast
Comparison: None.

CLINICAL DATA: Threatened abortion, first trimester. Vaginal
bleeding.

EXAM:
OBSTETRIC <14 WK US AND TRANSVAGINAL OB US
TECHNIQUE: Both transabdominal and transvaginal ultrasound examinations were
performed for complete evaluation of the gestation as well as the
maternal uterus, adnexal regions, and pelvic cul-de-sac.
Transvaginal technique was performed to assess early pregnancy.

[Series 1: us ob < 14 weeks - us ob tv · 62 acquisitions, 15 frames shown]
[im 1/62]
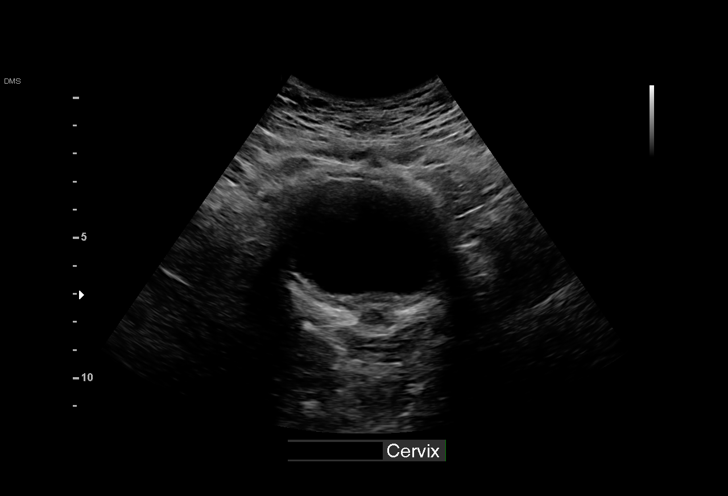
[im 5/62]
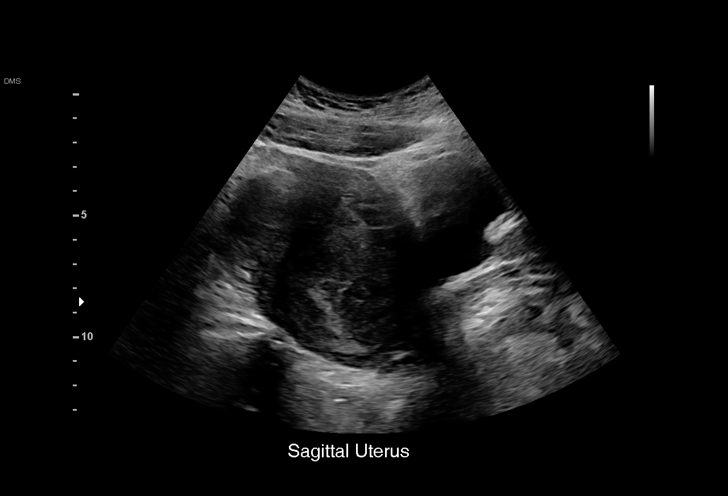
[im 10/62]
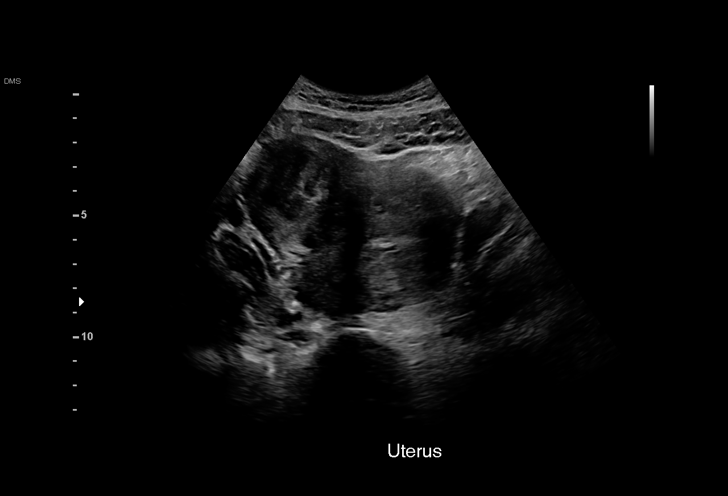
[im 14/62]
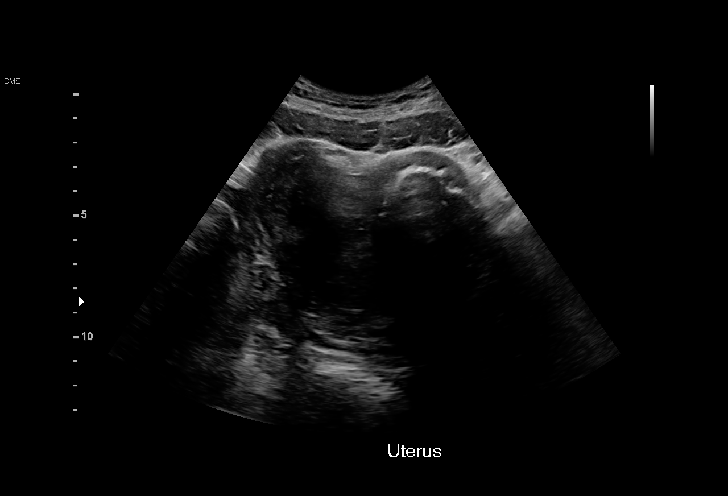
[im 19/62]
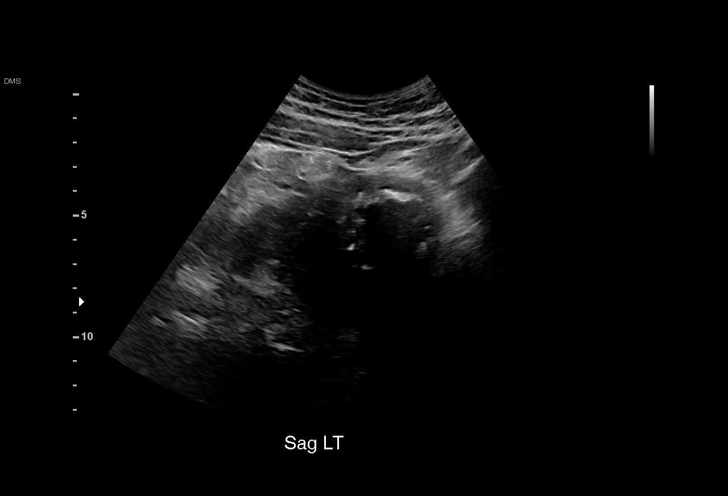
[im 23/62]
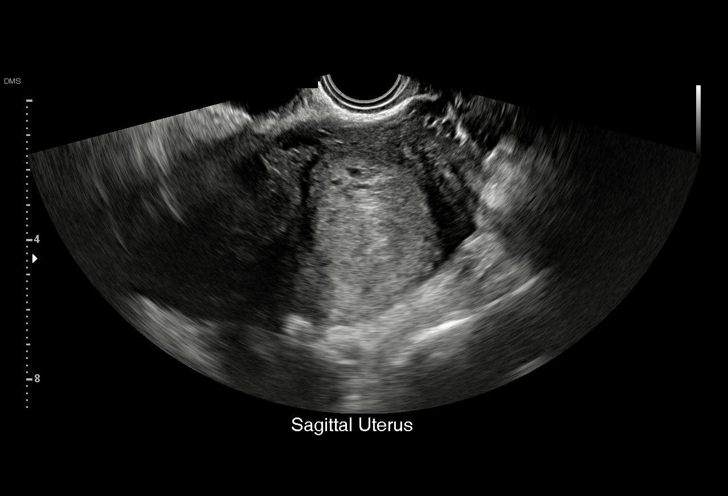
[im 28/62]
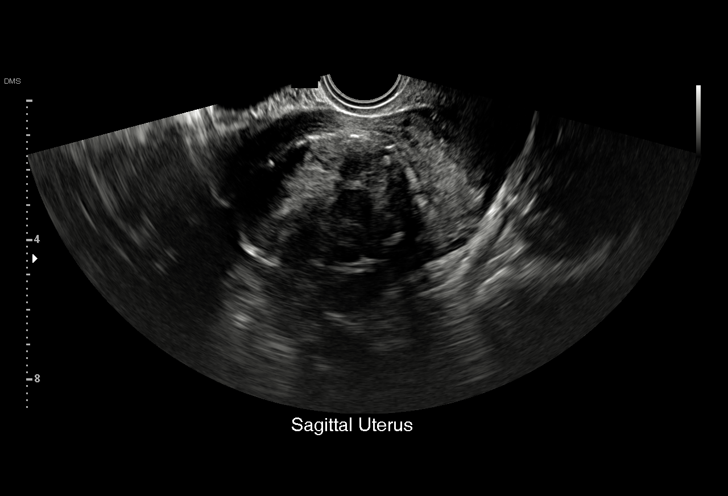
[im 32/62]
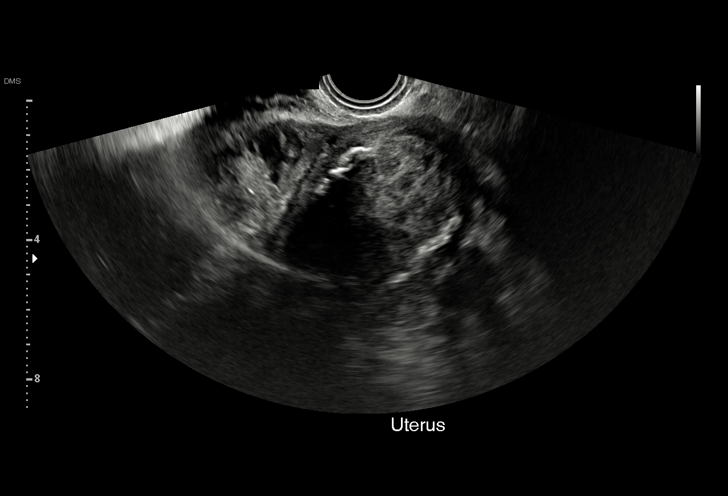
[im 34/62]
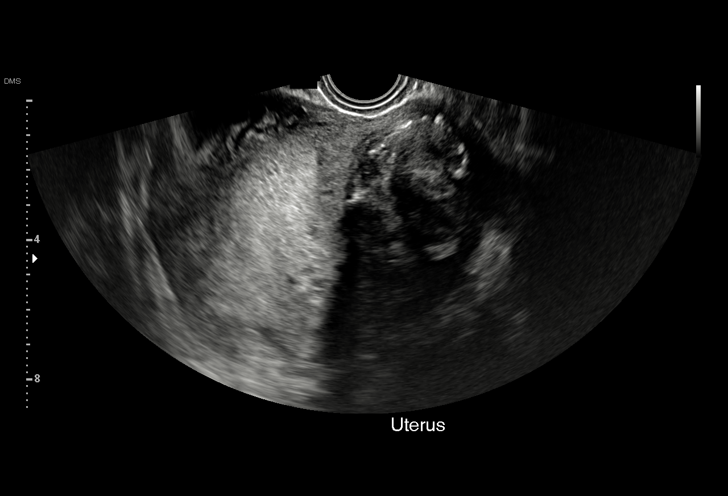
[im 39/62]
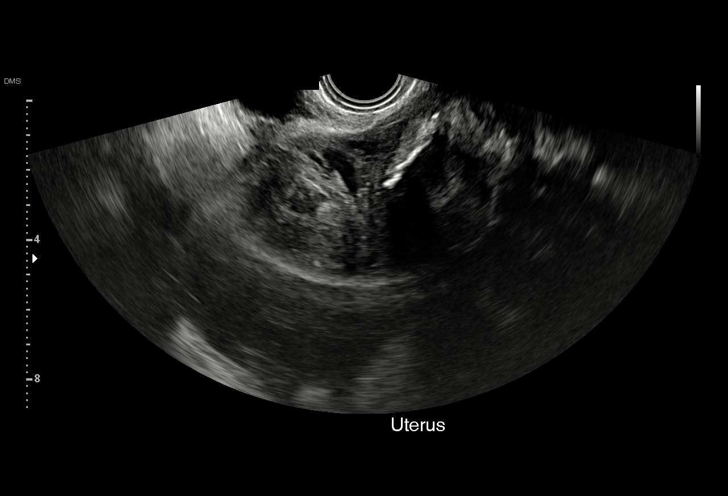
[im 43/62]
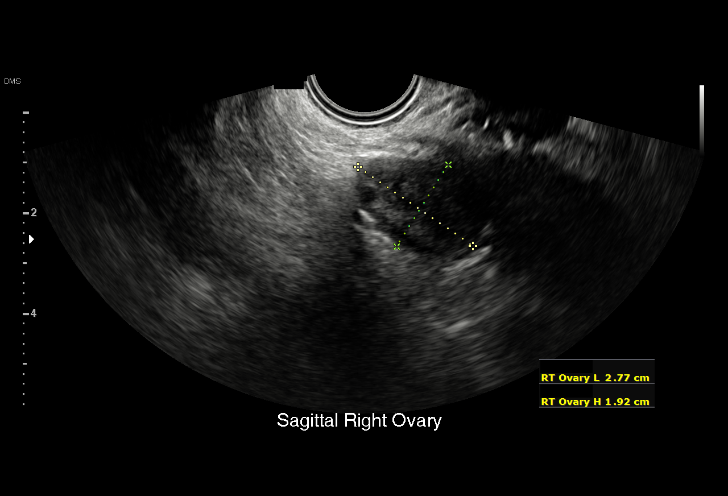
[im 48/62]
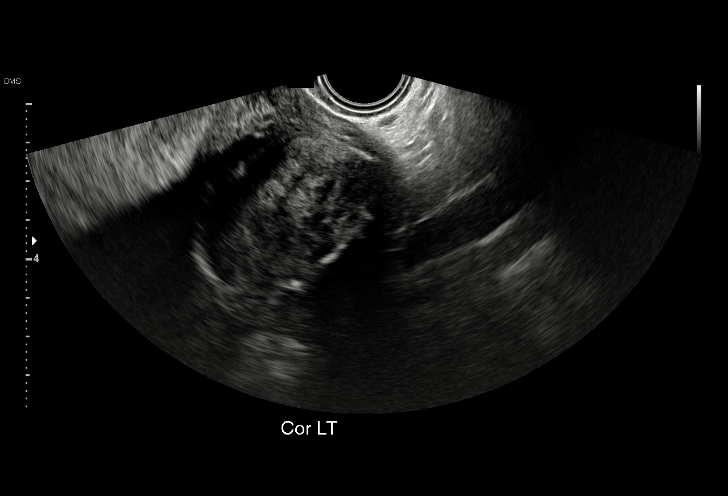
[im 52/62]
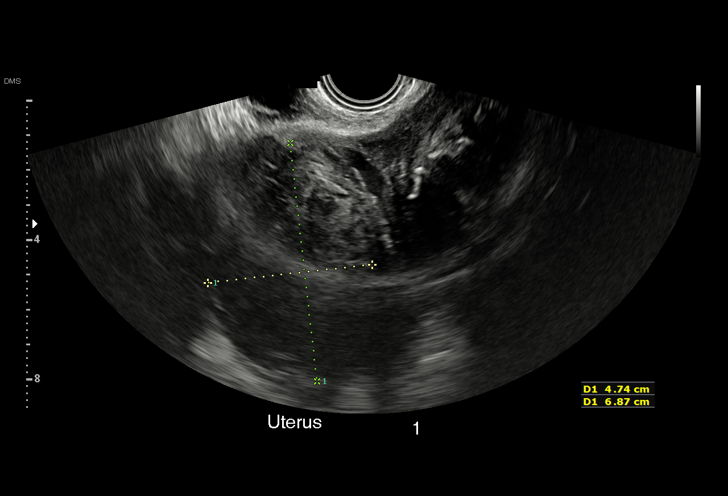
[im 57/62]
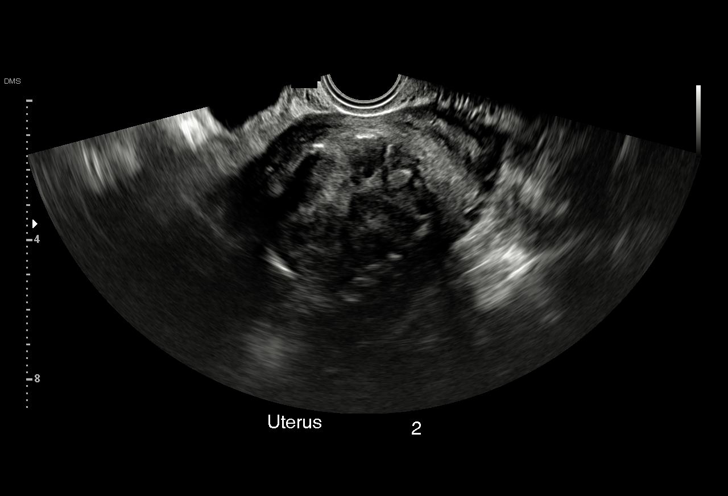
[im 62/62]
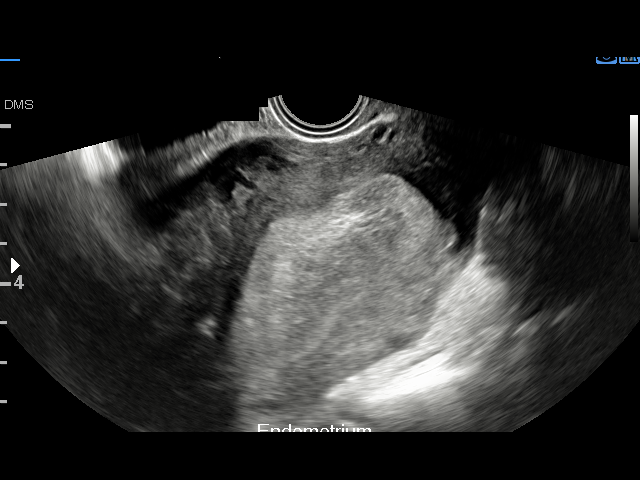

[15 of 28 positions shown; findings below may reference images not displayed]

FINDINGS: Intrauterine gestational sac: None

Maternal uterus/adnexae: The ovaries are normal in appearance.

Two uterine fibroids are noted measuring 6.9 and 5.8 cm. The
endometrium is thickened and heterogeneous in appearance. No
intrauterine pregnancy. No free fluid in the pelvis.
IMPRESSION: 1. No intrauterine pregnancy identified. In the setting of a
positive pregnancy test, this could represent ectopic pregnancy,
early pregnancy, or recent miscarriage. Recommend clinical
correlation and close follow-up.
2. Two fibroids seen in the uterus.

## 2022-01-12 DIAGNOSIS — Z113 Encounter for screening for infections with a predominantly sexual mode of transmission: Secondary | ICD-10-CM | POA: Diagnosis not present

## 2022-01-12 DIAGNOSIS — Z139 Encounter for screening, unspecified: Secondary | ICD-10-CM | POA: Diagnosis not present

## 2022-01-12 DIAGNOSIS — Z124 Encounter for screening for malignant neoplasm of cervix: Secondary | ICD-10-CM | POA: Diagnosis not present

## 2022-01-12 DIAGNOSIS — Z0000 Encounter for general adult medical examination without abnormal findings: Secondary | ICD-10-CM | POA: Diagnosis not present

## 2022-01-12 DIAGNOSIS — E559 Vitamin D deficiency, unspecified: Secondary | ICD-10-CM | POA: Diagnosis not present

## 2022-01-25 DIAGNOSIS — F4320 Adjustment disorder, unspecified: Secondary | ICD-10-CM | POA: Diagnosis not present

## 2022-02-08 DIAGNOSIS — F4320 Adjustment disorder, unspecified: Secondary | ICD-10-CM | POA: Diagnosis not present

## 2022-02-15 DIAGNOSIS — F4320 Adjustment disorder, unspecified: Secondary | ICD-10-CM | POA: Diagnosis not present

## 2022-02-16 DIAGNOSIS — D259 Leiomyoma of uterus, unspecified: Secondary | ICD-10-CM | POA: Diagnosis not present

## 2022-02-16 DIAGNOSIS — R1032 Left lower quadrant pain: Secondary | ICD-10-CM | POA: Diagnosis not present

## 2022-02-22 DIAGNOSIS — F4320 Adjustment disorder, unspecified: Secondary | ICD-10-CM | POA: Diagnosis not present

## 2022-03-01 DIAGNOSIS — F4320 Adjustment disorder, unspecified: Secondary | ICD-10-CM | POA: Diagnosis not present

## 2022-03-15 ENCOUNTER — Encounter: Payer: Self-pay | Admitting: Nurse Practitioner

## 2022-03-15 ENCOUNTER — Ambulatory Visit (INDEPENDENT_AMBULATORY_CARE_PROVIDER_SITE_OTHER): Payer: BC Managed Care – PPO | Admitting: Nurse Practitioner

## 2022-03-15 VITALS — BP 126/82 | HR 64 | Temp 97.4°F | Ht 68.0 in | Wt 240.4 lb

## 2022-03-15 DIAGNOSIS — D259 Leiomyoma of uterus, unspecified: Secondary | ICD-10-CM | POA: Insufficient documentation

## 2022-03-15 DIAGNOSIS — Z6836 Body mass index (BMI) 36.0-36.9, adult: Secondary | ICD-10-CM | POA: Diagnosis not present

## 2022-03-15 DIAGNOSIS — Z833 Family history of diabetes mellitus: Secondary | ICD-10-CM

## 2022-03-15 DIAGNOSIS — R519 Headache, unspecified: Secondary | ICD-10-CM | POA: Diagnosis not present

## 2022-03-15 DIAGNOSIS — Z8249 Family history of ischemic heart disease and other diseases of the circulatory system: Secondary | ICD-10-CM

## 2022-03-15 DIAGNOSIS — F4320 Adjustment disorder, unspecified: Secondary | ICD-10-CM | POA: Diagnosis not present

## 2022-03-15 LAB — COMPREHENSIVE METABOLIC PANEL
ALT: 12 U/L (ref 0–35)
AST: 17 U/L (ref 0–37)
Albumin: 4.4 g/dL (ref 3.5–5.2)
Alkaline Phosphatase: 53 U/L (ref 39–117)
BUN: 9 mg/dL (ref 6–23)
CO2: 24 mEq/L (ref 19–32)
Calcium: 8.9 mg/dL (ref 8.4–10.5)
Chloride: 102 mEq/L (ref 96–112)
Creatinine, Ser: 0.84 mg/dL (ref 0.40–1.20)
GFR: 92.47 mL/min (ref >60.00)
Glucose, Bld: 79 mg/dL (ref 70–99)
Potassium: 3.7 mEq/L (ref 3.5–5.1)
Sodium: 135 mEq/L (ref 135–145)
Total Bilirubin: 0.2 mg/dL (ref 0.2–1.2)
Total Protein: 7.4 g/dL (ref 6.0–8.3)

## 2022-03-15 LAB — HEMOGLOBIN A1C: Hgb A1c MFr Bld: 6.2 % (ref 4.6–6.5)

## 2022-03-15 LAB — CBC WITH DIFFERENTIAL/PLATELET
Basophils Absolute: 0.1 10*3/uL (ref 0.0–0.1)
Basophils Relative: 1.2 % (ref 0.0–3.0)
Eosinophils Absolute: 0.2 10*3/uL (ref 0.0–0.7)
Eosinophils Relative: 3.3 % (ref 0.0–5.0)
HCT: 33.9 % — ABNORMAL LOW (ref 36.0–46.0)
Hemoglobin: 11.1 g/dL — ABNORMAL LOW (ref 12.0–15.0)
Lymphocytes Relative: 36.2 % (ref 12.0–46.0)
Lymphs Abs: 1.8 10*3/uL (ref 0.7–4.0)
MCHC: 32.6 g/dL (ref 30.0–36.0)
MCV: 78.2 fl (ref 78.0–100.0)
Monocytes Absolute: 0.4 10*3/uL (ref 0.1–1.0)
Monocytes Relative: 8.9 % (ref 3.0–12.0)
Neutro Abs: 2.6 10*3/uL (ref 1.4–7.7)
Neutrophils Relative %: 50.4 % (ref 43.0–77.0)
Platelets: 353 10*3/uL (ref 150.0–400.0)
RBC: 4.33 Mil/uL (ref 3.87–5.11)
RDW: 16.3 % — ABNORMAL HIGH (ref 11.5–15.5)
WBC: 5.1 10*3/uL (ref 4.0–10.5)

## 2022-03-15 LAB — TSH: TSH: 1.39 u[IU]/mL (ref 0.35–5.50)

## 2022-03-15 NOTE — Patient Instructions (Addendum)
Thank you for choosing [facility]  Go to lab for blood draw  Sign medical release form to get your records from [facility] Calories are units of energy. Your body needs a certain number of calories from food to keep going throughout the day. When you eat or drink more calories than your body needs, your body stores the extra calories mostly as fat. When you eat or drink fewer calories than your body needs, your body burns fat to get the energy it needs. Calorie counting means keeping track of how many calories you eat and drink each day. Calorie counting can be helpful if you need to lose weight. If you eat fewer calories than your body needs, you should lose weight. Ask your health care provider what a healthy weight is for you. For calorie counting to work, you will need to eat the right number of calories each day to lose a healthy amount of weight per week. A dietitian can help you figure out how many calories you need in a day and will suggest ways to reach your calorie goal. A healthy amount of weight to lose each week is usually 1-2 lb (0.5-0.9 kg). This usually means that your daily calorie intake should be reduced by 500-750 calories. Eating 1,200-1,500 calories a day can help most women lose weight. Eating 1,500-1,800 calories a day can help most men lose weight. What do I need to know about calorie counting? Work with your health care provider or dietitian to determine how many calories you should get each day. To meet your daily calorie goal, you will need to: Find out how many calories are in each food that you would like to eat. Try to do this before you eat. Decide how much of the food you plan to eat. Keep a food log. Do this by writing down what you ate and how many calories it had. To successfully lose weight, it is important to balance calorie counting with a healthy lifestyle that includes regular activity. Where do I find  calorie information?  The number of calories in a food can be found on a Nutrition Facts label. If a food does not have a Nutrition Facts label, try to look up the calories online or ask your dietitian for help. Remember that calories are listed per serving. If you choose to have more than one serving of a food, you will have to multiply the calories per serving by the number of servings you plan to eat. For example, the label on a package of bread might say that a serving size is 1 slice and that there are 90 calories in a serving. If you eat 1 slice, you will have eaten 90 calories. If you eat 2 slices, you will have eaten 180 calories. How do I keep a food log? After each time that you eat, record the following in your food log as soon as possible: What you ate. Be sure to include toppings, sauces, and other extras on the food. How much you ate. This can be measured in cups, ounces, or number of items. How many calories were in each food and drink. The total number of calories in the food you ate. Keep your food log near you, such as in a pocket-sized notebook or on an app or website on your mobile phone. Some programs will calculate calories for you and show you how many calories you have left to meet your daily goal.  What are some portion-control tips? Know how many calories are in a serving. This will help you know how many servings you can have of a certain food. Use a measuring cup to measure serving sizes. You could also try weighing out portions on a kitchen scale. With time, you will be able to estimate serving sizes for some foods. Take time to put servings of different foods on your favorite plates or in your favorite bowls and cups so you know what a serving looks like. Try not to eat straight from a food's packaging, such as from a bag or box. Eating straight from the package makes it hard to see how much you are eating and can lead to overeating. Put the amount you would like to eat in  a cup or on a plate to make sure you are eating the right portion. Use smaller plates, glasses, and bowls for smaller portions and to prevent overeating. Try not to multitask. For example, avoid watching TV or using your computer while eating. If it is time to eat, sit down at a table and enjoy your food. This will help you recognize when you are full. It will also help you be more mindful of what and how much you are eating. What are tips for following this plan? Reading food labels Check the calorie count compared with the serving size. The serving size may be smaller than what you are used to eating. Check the source of the calories. Try to choose foods that are high in protein, fiber, and vitamins, and low in saturated fat, trans fat, and sodium. Shopping Read nutrition labels while you shop. This will help you make healthy decisions about which foods to buy. Pay attention to nutrition labels for low-fat or fat-free foods. These foods sometimes have the same number of calories or more calories than the full-fat versions. They also often have added sugar, starch, or salt to make up for flavor that was removed with the fat. Make a grocery list of lower-calorie foods and stick to it. Cooking Try to cook your favorite foods in a healthier way. For example, try baking instead of frying. Use low-fat dairy products. Meal planning Use more fruits and vegetables. One-half of your plate should be fruits and vegetables. Include lean proteins, such as chicken, [place], and fish. Lifestyle Each week, aim to do one of the following: 150 minutes of moderate exercise, such as walking. 75 minutes of vigorous exercise, such as running. General information Know how many calories are in the foods you eat most often. This will help you calculate calorie counts faster. Find a way of tracking calories that works for you. Get creative. Try different apps or programs if writing down calories does not work for  you. What foods should I eat?  Eat nutritious foods. It is better to have a nutritious, high-calorie food, such as an avocado, than a food with few nutrients, such as a bag of potato chips. Use your calories on foods and drinks that will fill you up and will not leave you hungry soon after eating. Examples of foods that fill you up are nuts and nut butters, vegetables, lean proteins, and high-fiber foods such as whole grains. High-fiber foods are foods with more than 5 g of fiber per serving. Pay attention to calories in drinks. Low-calorie drinks include water and unsweetened drinks. The items listed above may not be a complete list of foods and beverages you can eat. Contact a dietitian for more information.  What foods should I limit? Limit foods or drinks that are not good sources of vitamins, minerals, or protein or that are high in unhealthy fats. These include: Candy. Other sweets. Sodas, specialty coffee drinks, alcohol, and juice. The items listed above may not be a complete list of foods and beverages you should avoid. Contact a dietitian for more information. How do I count calories when eating out? Pay attention to portions. Often, portions are much larger when eating out. Try these tips to keep portions smaller: Consider sharing a meal instead of getting your own. If you get your own meal, eat only half of it. Before you start eating, ask for a container and put half of your meal into it. When available, consider ordering smaller portions from the menu instead of full portions. Pay attention to your food and drink choices. Knowing the way food is cooked and what is included with the meal can help you eat fewer calories. If calories are listed on the menu, choose the lower-calorie options. Choose dishes that include vegetables, fruits, whole grains, low-fat dairy products, and lean proteins. Choose items that are boiled, broiled, grilled, or steamed. Avoid items that are buttered,  battered, fried, or served with cream sauce. Items labeled as crispy are usually fried, unless stated otherwise. Choose water, low-fat milk, unsweetened iced tea, or other drinks without added sugar. If you want an alcoholic beverage, choose a lower-calorie option, such as a glass of wine or light beer. Ask for dressings, sauces, and syrups on the side. These are usually high in calories, so you should limit the amount you eat. If you want a salad, choose a garden salad and ask for grilled meats. Avoid extra toppings such as bacon, cheese, or fried items. Ask for the dressing on the side, or ask for olive oil and vinegar or lemon to use as dressing. Estimate how many servings of a food you are given. Knowing serving sizes will help you be aware of how much food you are eating at restaurants. Where to find more information Centers for Disease Control and Prevention: http://www.wolf.info/ U.S. Department of Agriculture: http://www.wilson-mendoza.org/ Summary Calorie counting means keeping track of how many calories you eat and drink each day. If you eat fewer calories than your body needs, you should lose weight. A healthy amount of weight to lose per week is usually 1-2 lb (0.5-0.9 kg). This usually means reducing your daily calorie intake by 500-750 calories. The number of calories in a food can be found on a Nutrition Facts label. If a food does not have a Nutrition Facts label, try to look up the calories online or ask your dietitian for help. Use smaller plates, glasses, and bowls for smaller portions and to prevent overeating. Use your calories on foods and drinks that will fill you up and not leave you hungry shortly after a meal. This information is not intended to replace advice given to you by your health care provider. Make sure you discuss any questions you have with your health care provider. Document Revised: 08/22/2019 Document Reviewed: 08/22/2019 Elsevier Patient Education  [place].  How to Increase  Your Level of Physical Activity Getting regular physical activity is important for your overall health and well-being. Most people do not get enough exercise. There are easy ways to increase your level of physical activity, even if you have not been very active in the past or if you are just starting out. What are the benefits of physical activity? Physical activity  has many short-term and long-term benefits. Being active on a regular basis can improve your physical and mental health as well as provide other benefits. Physical health benefits Helping you lose weight or maintain a healthy weight. Strengthening your muscles and bones. Reducing your risk of certain long-term (chronic) diseases, including heart disease, cancer, and diabetes. Being able to move around more easily and for longer periods of time without getting tired (increased endurance or stamina). Improving your ability to fight off illness (enhanced immunity). Being able to sleep better. Helping you stay healthy as you get older, including: Helping you stay mobile, or capable of walking and moving around. Preventing accidents, such as falls. Increasing life expectancy. Mental health benefits Boosting your mood and improving your self-esteem. Lowering your chance of having mental health problems, such as depression or anxiety. Helping you feel good about your body. Other benefits Finding new sources of fun and enjoyment. Meeting new people who share a common interest. Before you begin If you have a chronic illness or have not been active for a while, check with your health care provider about how to get started. Ask your health care provider what activities are safe for you. Start out slowly. Walking or doing some simple chair exercises is a good place to start, especially if you have not been active before or for a long time. Set goals that you can work toward. Ask your health care provider how much exercise is best for you. In  general, most adults should: Do moderate-intensity exercise for at least 150 minutes each week (30 minutes on most days of the week) or vigorous exercise for at least 75 minutes each week, or a combination of these. Moderate-intensity exercise can include walking at a quick pace, biking, yoga, water aerobics, or gardening. Vigorous exercise involves activities that take more effort, such as jogging or running, playing sports, swimming laps, or jumping rope. Do strength exercises on at least 2 days each week. This can include weight lifting, body weight exercises, and resistance-band exercises. How to be more physically active Make a plan  Try to find activities that you enjoy. You are more likely to commit to an exercise routine if it does not feel like a chore. If you have bone or joint problems, choose low-impact exercises, like walking or swimming. Use these tips for being successful with an exercise plan: Find a workout partner for accountability. Join a group or class, such as an aerobics class, cycling class, or sports team. Make family time active. Go for a walk, bike, or swim. Include a variety of exercises each week. Consider using a fitness tracker, such as a mobile phone app or a device worn like a watch, that will count the number of steps you take each day. Many people strive to reach 10,000 steps a day. Find ways to be active in your daily routines Besides your formal exercise plans, you can find ways to do physical activity during your daily routines, such as: Walking or biking to work or to the store. Taking the stairs instead of the elevator. Parking farther away from the door at work or at the store. Planning walking meetings. Walking around while you are on the phone. Where to find more information Centers for Disease Control and Prevention: WorkDashboard.es President's Council on Fitness, Sports & Nutrition: www.fitness.gov ChooseMyPlate:  MassVoice.es Contact a health care provider if: You have headaches, muscle aches, or joint pain that is concerning. You feel dizzy or light-headed while exercising. You faint. You  feel your heart skipping, racing, or fluttering. You have chest pain while exercising. Summary Exercise benefits your mind and body at any age, even if you are just starting out. If you have a chronic illness or have not been active for a while, check with your health care provider before increasing your physical activity. Choose activities that are safe and enjoyable for you. Ask your health care provider what activities are safe for you. Start slowly. Tell your health care provider if you have problems as you start to increase your activity level. This information is not intended to replace advice given to you by your health care provider. Make sure you discuss any questions you have with your health care provider. Document Revised: 11/06/2020 Document Reviewed: 11/06/2020 Elsevier Patient Education  [place].

## 2022-03-15 NOTE — Progress Notes (Signed)
New patient visit  Patient: Dawn Norman   DOB: 09-Aug-1990   31 y.o. Female  MRN: [id number] Visit Date: 03/15/2022  Subjective:    Chief Complaint  Patient presents with   Establish Care    New pt, est care C/o random headache that are off & on for some years associated with some chest tightness as well. No other concerns  Requested records for PAP   LOURINE ALBERICO is a 31 y.o. female who presents today as a new patient to establish care.  HPI  [facility]N: [facility], last appt GYN No pcp since 2016. Nonintractable episodic headache Chronic headache, intermittent, onset 2016 Trigger: stress, bending forward, rapid head movement Sharp pain, last for few seconds, on left side above eye No change in vision, no tinnitus Resolved paresthesia in fingers No Fhx of migraine No hx of seizure. Head injury in high school: hit forehead on basketball pole, with brief LOC.  Keep headache diary Use tylenol or ibuprofen as needed for pain.  Class 2 severe obesity due to excess calories with serious comorbidity and body mass index (BMI) of 36.0 to 36.9 in adult [facility]) Check CMP, TSH and hgbA1c Provided printed information on necessary diet modification and exercise F/up in 59months  Most recent fall risk assessment:    03/15/2022    1:07 PM  Fall Risk   Falls in the past year? 0  Number falls in past yr: 0  Injury with Fall? 0   Most recent depression screenings:    03/15/2022    1:56 PM  PHQ 2/9 Scores  PHQ - 2 Score 1  PHQ- 9 Score 2   No problem-specific Assessment & Plan notes found for this encounter.  Past Medical History:  Diagnosis Date   Maternal anemia, with delivery 01/22/2018   Numbness of fingers    Paresthesia    Vitamin D deficiency    Past Surgical History:  Procedure Laterality Date   EXTERNAL EAR SURGERY     Social History   Tobacco Use   Smoking status: Never   Smokeless tobacco: Never  Vaping Use   Vaping Use: Never used  Substance Use  Topics   Alcohol use: Yes    Alcohol/week: 2.0 standard drinks of alcohol    Types: 2 Glasses of wine per week    Comment: social   Drug use: No   Family History  Problem Relation Age of Onset   Diabetes Mother    Cancer Mother 16       multiple myeloma   Asthma Sister    Diabetes Maternal Aunt    Diabetes Maternal Grandmother    Thyroid disease Maternal Grandmother    Hyperlipidemia Paternal Grandmother    Thyroid disease Paternal Grandmother    Heart disease Paternal Grandfather 19   Arrhythmia Paternal Grandfather        defribillation         06/25/2018    4:30 PM 07/20/2019    9:22 PM 03/15/2022    1:07 PM  Fall Risk  Falls in the past year?   0  Was there an injury with Fall?   0  Fall Risk Category Calculator   0  Fall Risk Category   Low  Patient Fall Risk Level Low fall risk Low fall risk    Outpatient Medications Prior to Visit  Medication Sig   [DISCONTINUED] acetaminophen (TYLENOL) 500 MG tablet Take 1,000 mg by mouth every 6 (six) hours as needed for mild  pain. (Patient not taking: Reported on 03/15/2022)   [DISCONTINUED] acidophilus (RISAQUAD) CAPS capsule Take 1 capsule by mouth daily. (Patient not taking: Reported on 03/15/2022)   [DISCONTINUED] cyclobenzaprine (FLEXERIL) 10 MG tablet Take 1 tablet (10 mg total) by mouth 2 (two) times daily as needed for muscle spasms. (Patient not taking: Reported on 03/15/2022)   [DISCONTINUED] iron polysaccharides (NIFEREX) 150 MG capsule Take 1 capsule (150 mg total) by mouth daily. (Patient not taking: Reported on 03/15/2022)   [DISCONTINUED] magnesium oxide (MAG-OX) 400 (241.3 Mg) MG tablet Take 1 tablet (400 mg total) by mouth daily. (Patient not taking: Reported on 03/15/2022)   [DISCONTINUED] naproxen (NAPROSYN) 375 MG tablet Take 1 tablet (375 mg total) by mouth 2 (two) times daily. (Patient not taking: Reported on 03/15/2022)   [DISCONTINUED] Prenatal MV & Min w/FA-DHA (PRENATAL ADULT GUMMY/DHA/FA PO) Take 2 each by  mouth daily. (Patient not taking: Reported on 03/15/2022)   No facility-administered medications prior to visit.   No Known Allergies  Patient Care Team: [facility], [facility] as PCP - General (Family Medicine) Ob/Gyn, [facility] (Obstetrics and Gynecology)  Review of Systems  Constitutional:  Negative for fever.  HENT:  Negative for congestion and sore throat.   Eyes:  Negative for photophobia and visual disturbance.       Negative for visual changes  Respiratory:  Negative for cough and shortness of breath.   Cardiovascular:  Negative for chest pain, palpitations and leg swelling.  Gastrointestinal:  Negative for blood in stool, constipation and diarrhea.  Genitourinary:  Negative for dysuria, frequency and urgency.  Musculoskeletal:  Negative for myalgias.  Skin:  Negative for rash.  Neurological:  Positive for headaches. Negative for dizziness.  Hematological:  Does not bruise/bleed easily.  Psychiatric/Behavioral:  Negative for suicidal ideas. The patient is not nervous/anxious.    Last CBC Lab Results  Component Value Date   WBC 17.0 (H) 01/22/2018   HGB 9.4 (L) 01/22/2018   HCT 27.1 (L) 01/22/2018   MCV 83.1 01/22/2018   MCH 28.8 01/22/2018   RDW 14.8 01/22/2018   PLT 199 03/47/4259   Last metabolic panel Lab Results  Component Value Date   GLUCOSE 105 (H) 01/21/2018   NA 129 (L) 01/21/2018   K 3.4 (L) 01/21/2018   CL 98 01/21/2018   CO2 21 (L) 01/21/2018   BUN 10 06/29/2018   CREATININE 0.66 06/29/2018   GFRNONAA >60 06/29/2018   CALCIUM 8.8 (L) 01/21/2018   PROT 7.3 01/21/2018   ALBUMIN 3.2 (L) 01/21/2018   BILITOT 0.6 01/21/2018   ALKPHOS 57 01/21/2018   AST 15 06/29/2018   ALT 10 01/21/2018   ANIONGAP 10 01/21/2018       Objective:  BP 126/82 (BP Location: Right Arm, Patient Position: Sitting, Cuff Size: Normal)   Pulse 64   Temp (!) 97.4 F (36.3 C) (Temporal)   Ht $R'5\' 8"'Ky$  (1.727 m)   Wt 240 lb 6.4 oz (109 kg)   LMP  03/01/2022 (Exact Date)   SpO2 98%   Breastfeeding No   BMI 36.55 kg/m     BP Readings from Last 3 Encounters:  03/15/22 126/82  07/20/19 124/76  06/29/18 131/78   Wt Readings from Last 3 Encounters:  03/15/22 240 lb 6.4 oz (109 kg)  07/20/19 225 lb (102.1 kg)  06/29/18 231 lb (104.8 kg)     Physical Exam Vitals reviewed.  Eyes:     Extraocular Movements: Extraocular movements intact.     Conjunctiva/sclera:  Conjunctivae normal.     Pupils: Pupils are equal, round, and reactive to light.  Cardiovascular:     Rate and Rhythm: Normal rate and regular rhythm.     Pulses: Normal pulses.     Heart sounds: Normal heart sounds.  Pulmonary:     Effort: Pulmonary effort is normal.     Breath sounds: Normal breath sounds.  Musculoskeletal:        General: Normal range of motion.     Cervical back: Normal range of motion and neck supple.     Right lower leg: No edema.     Left lower leg: No edema.  Lymphadenopathy:     Cervical: No cervical adenopathy.  Skin:    General: Skin is warm and dry.     Findings: No erythema or rash.  Neurological:     Mental Status: She is alert and oriented to person, place, and time.     Cranial Nerves: No cranial nerve deficit.  Psychiatric:        Mood and Affect: Mood normal.        Behavior: Behavior normal.        Thought Content: Thought content normal.    No results found for any visits on 03/15/22.    Assessment & Plan:    Problem List Items Addressed This Visit   None Visit Diagnoses     Class 2 severe obesity due to excess calories with serious comorbidity and body mass index (BMI) of 36.0 to 36.9 in adult [facility])    -  Primary   Relevant Orders   CBC with Differential/Platelet   Comprehensive metabolic panel   TSH   Hemoglobin A1c   Family history of diabetes mellitus (DM)       Relevant Orders   Hemoglobin A1c   Family history of premature CAD       Nonintractable episodic headache, unspecified headache type           Return in about 3 months (around 06/15/2022) for Weight management and headache.      Wilfred Lacy, NP

## 2022-03-18 DIAGNOSIS — Z8249 Family history of ischemic heart disease and other diseases of the circulatory system: Secondary | ICD-10-CM | POA: Insufficient documentation

## 2022-03-18 DIAGNOSIS — R519 Headache, unspecified: Secondary | ICD-10-CM | POA: Insufficient documentation

## 2022-03-18 DIAGNOSIS — Z833 Family history of diabetes mellitus: Secondary | ICD-10-CM | POA: Insufficient documentation

## 2022-03-18 NOTE — Assessment & Plan Note (Signed)
Check CMP, TSH and hgbA1c Provided printed information on necessary diet modification and exercise F/up in 17month

## 2022-03-18 NOTE — Assessment & Plan Note (Signed)
Chronic headache, intermittent, onset 2016 Trigger: stress, bending forward, rapid head movement Sharp pain, last for few seconds, on left side above eye No change in vision, no tinnitus Resolved paresthesia in fingers No Fhx of migraine No hx of seizure. Head injury in high school: hit forehead on basketball pole, with brief LOC.  Keep headache diary Use tylenol or ibuprofen as needed for pain.

## 2022-04-12 DIAGNOSIS — F4320 Adjustment disorder, unspecified: Secondary | ICD-10-CM | POA: Diagnosis not present

## 2022-04-26 DIAGNOSIS — F4320 Adjustment disorder, unspecified: Secondary | ICD-10-CM | POA: Diagnosis not present

## 2022-06-07 DIAGNOSIS — F4320 Adjustment disorder, unspecified: Secondary | ICD-10-CM | POA: Diagnosis not present

## 2022-06-21 DIAGNOSIS — F4320 Adjustment disorder, unspecified: Secondary | ICD-10-CM | POA: Diagnosis not present

## 2022-07-05 DIAGNOSIS — F4320 Adjustment disorder, unspecified: Secondary | ICD-10-CM | POA: Diagnosis not present

## 2022-08-30 DIAGNOSIS — F4320 Adjustment disorder, unspecified: Secondary | ICD-10-CM | POA: Diagnosis not present

## 2022-09-13 DIAGNOSIS — F4320 Adjustment disorder, unspecified: Secondary | ICD-10-CM | POA: Diagnosis not present

## 2022-09-27 DIAGNOSIS — F4322 Adjustment disorder with anxiety: Secondary | ICD-10-CM | POA: Diagnosis not present

## 2022-10-27 DIAGNOSIS — D259 Leiomyoma of uterus, unspecified: Secondary | ICD-10-CM | POA: Diagnosis not present

## 2022-11-03 DIAGNOSIS — D259 Leiomyoma of uterus, unspecified: Secondary | ICD-10-CM | POA: Diagnosis not present

## 2023-01-19 DIAGNOSIS — Z01419 Encounter for gynecological examination (general) (routine) without abnormal findings: Secondary | ICD-10-CM | POA: Diagnosis not present

## 2023-01-19 DIAGNOSIS — Z124 Encounter for screening for malignant neoplasm of cervix: Secondary | ICD-10-CM | POA: Diagnosis not present

## 2023-01-19 DIAGNOSIS — R7309 Other abnormal glucose: Secondary | ICD-10-CM | POA: Diagnosis not present

## 2023-01-19 DIAGNOSIS — E559 Vitamin D deficiency, unspecified: Secondary | ICD-10-CM | POA: Diagnosis not present

## 2023-09-07 DIAGNOSIS — F411 Generalized anxiety disorder: Secondary | ICD-10-CM | POA: Diagnosis not present

## 2023-09-21 DIAGNOSIS — F411 Generalized anxiety disorder: Secondary | ICD-10-CM | POA: Diagnosis not present

## 2023-10-05 DIAGNOSIS — F411 Generalized anxiety disorder: Secondary | ICD-10-CM | POA: Diagnosis not present

## 2023-10-19 DIAGNOSIS — F411 Generalized anxiety disorder: Secondary | ICD-10-CM | POA: Diagnosis not present

## 2023-11-02 DIAGNOSIS — Z1331 Encounter for screening for depression: Secondary | ICD-10-CM | POA: Diagnosis not present

## 2023-11-02 DIAGNOSIS — R519 Headache, unspecified: Secondary | ICD-10-CM | POA: Diagnosis not present

## 2023-11-02 DIAGNOSIS — R109 Unspecified abdominal pain: Secondary | ICD-10-CM | POA: Diagnosis not present

## 2023-11-02 DIAGNOSIS — F411 Generalized anxiety disorder: Secondary | ICD-10-CM | POA: Diagnosis not present

## 2023-11-02 DIAGNOSIS — D500 Iron deficiency anemia secondary to blood loss (chronic): Secondary | ICD-10-CM | POA: Diagnosis not present

## 2023-11-02 DIAGNOSIS — E559 Vitamin D deficiency, unspecified: Secondary | ICD-10-CM | POA: Diagnosis not present

## 2023-11-02 DIAGNOSIS — R7303 Prediabetes: Secondary | ICD-10-CM | POA: Diagnosis not present

## 2023-11-23 DIAGNOSIS — F909 Attention-deficit hyperactivity disorder, unspecified type: Secondary | ICD-10-CM | POA: Diagnosis not present

## 2023-11-30 DIAGNOSIS — F411 Generalized anxiety disorder: Secondary | ICD-10-CM | POA: Diagnosis not present

## 2023-12-20 DIAGNOSIS — F411 Generalized anxiety disorder: Secondary | ICD-10-CM | POA: Diagnosis not present

## 2023-12-28 DIAGNOSIS — F411 Generalized anxiety disorder: Secondary | ICD-10-CM | POA: Diagnosis not present

## 2024-01-04 DIAGNOSIS — F411 Generalized anxiety disorder: Secondary | ICD-10-CM | POA: Diagnosis not present

## 2024-01-08 DIAGNOSIS — F411 Generalized anxiety disorder: Secondary | ICD-10-CM | POA: Diagnosis not present

## 2024-01-23 DIAGNOSIS — F411 Generalized anxiety disorder: Secondary | ICD-10-CM | POA: Diagnosis not present

## 2024-02-09 DIAGNOSIS — F909 Attention-deficit hyperactivity disorder, unspecified type: Secondary | ICD-10-CM | POA: Diagnosis not present

## 2024-02-10 DIAGNOSIS — F909 Attention-deficit hyperactivity disorder, unspecified type: Secondary | ICD-10-CM | POA: Diagnosis not present

## 2024-02-14 DIAGNOSIS — F411 Generalized anxiety disorder: Secondary | ICD-10-CM | POA: Diagnosis not present

## 2024-02-15 DIAGNOSIS — F419 Anxiety disorder, unspecified: Secondary | ICD-10-CM | POA: Diagnosis not present

## 2024-02-15 DIAGNOSIS — F900 Attention-deficit hyperactivity disorder, predominantly inattentive type: Secondary | ICD-10-CM | POA: Diagnosis not present

## 2024-02-27 DIAGNOSIS — F411 Generalized anxiety disorder: Secondary | ICD-10-CM | POA: Diagnosis not present

## 2024-02-29 DIAGNOSIS — F419 Anxiety disorder, unspecified: Secondary | ICD-10-CM | POA: Diagnosis not present

## 2024-02-29 DIAGNOSIS — F900 Attention-deficit hyperactivity disorder, predominantly inattentive type: Secondary | ICD-10-CM | POA: Diagnosis not present

## 2024-03-28 DIAGNOSIS — F411 Generalized anxiety disorder: Secondary | ICD-10-CM | POA: Diagnosis not present

## 2024-03-28 DIAGNOSIS — F900 Attention-deficit hyperactivity disorder, predominantly inattentive type: Secondary | ICD-10-CM | POA: Diagnosis not present

## 2024-04-10 DIAGNOSIS — F411 Generalized anxiety disorder: Secondary | ICD-10-CM | POA: Diagnosis not present

## 2024-04-15 DIAGNOSIS — F900 Attention-deficit hyperactivity disorder, predominantly inattentive type: Secondary | ICD-10-CM | POA: Diagnosis not present

## 2024-04-15 DIAGNOSIS — F411 Generalized anxiety disorder: Secondary | ICD-10-CM | POA: Diagnosis not present

## 2024-05-13 DIAGNOSIS — F900 Attention-deficit hyperactivity disorder, predominantly inattentive type: Secondary | ICD-10-CM | POA: Diagnosis not present

## 2024-05-13 DIAGNOSIS — F411 Generalized anxiety disorder: Secondary | ICD-10-CM | POA: Diagnosis not present

## 2024-06-10 DIAGNOSIS — F411 Generalized anxiety disorder: Secondary | ICD-10-CM | POA: Diagnosis not present

## 2024-06-10 DIAGNOSIS — F900 Attention-deficit hyperactivity disorder, predominantly inattentive type: Secondary | ICD-10-CM | POA: Diagnosis not present

## 2024-07-12 ENCOUNTER — Encounter (HOSPITAL_BASED_OUTPATIENT_CLINIC_OR_DEPARTMENT_OTHER): Payer: Self-pay

## 2024-07-12 ENCOUNTER — Emergency Department (HOSPITAL_BASED_OUTPATIENT_CLINIC_OR_DEPARTMENT_OTHER)
Admission: EM | Admit: 2024-07-12 | Discharge: 2024-07-12 | Disposition: A | Discharge status: Home / Self Care | Attending: Emergency Medicine | Admitting: Emergency Medicine

## 2024-07-12 ENCOUNTER — Other Ambulatory Visit: Payer: Self-pay

## 2024-07-12 DIAGNOSIS — Z23 Encounter for immunization: Secondary | ICD-10-CM | POA: Insufficient documentation

## 2024-07-12 DIAGNOSIS — S6992XA Unspecified injury of left wrist, hand and finger(s), initial encounter: Secondary | ICD-10-CM | POA: Diagnosis not present

## 2024-07-12 DIAGNOSIS — W268XXA Contact with other sharp object(s), not elsewhere classified, initial encounter: Secondary | ICD-10-CM | POA: Diagnosis not present

## 2024-07-12 DIAGNOSIS — S61211A Laceration without foreign body of left index finger without damage to nail, initial encounter: Secondary | ICD-10-CM | POA: Diagnosis not present

## 2024-07-12 MED ORDER — IBUPROFEN 400 MG PO TABS
600.0000 mg | ORAL_TABLET | Freq: Once | ORAL | Status: AC
  Administered 2024-07-12: 600 mg via ORAL
  Filled 2024-07-12: qty 1

## 2024-07-12 MED ORDER — TETANUS-DIPHTH-ACELL PERTUSSIS 5-2-15.5 LF-MCG/0.5 IM SUSP
0.5000 mL | Freq: Once | INTRAMUSCULAR | Status: AC
  Administered 2024-07-12: 0.5 mL via INTRAMUSCULAR
  Filled 2024-07-12: qty 0.5

## 2024-07-12 NOTE — Discharge Instructions (Signed)
 Your cut today was covered with skin glue to help it heal.  Please follow the instructions below:  Do not scratch, rub, or pick at the adhesive. Leave tissue adhesive in place. It will come off naturally after 7-10 days. Do not place tape over the adhesive. The adhesive could come off the wound when you pull the tape off. Do not take baths, swim, or use a hot tubs. You can shower after the first 24 hours. Cover the dressing with a watertight covering when you take a shower. Do not use any soaps, petroleum jelly products, or ointments on the wound. Certain ointments can weaken the adhesive.  Your tetanus was updated today.  Return the ER for any pus draining from the wound, increased redness around the wound, any other new or concerning symptoms.

## 2024-07-12 NOTE — ED Provider Notes (Cosign Needed)
 " [place] [facility] AT [facility] Provider Note   CSN: [id number] Arrival date & time: 07/12/24  1412     Patient presents with: Laceration (Left hand)   Dawn Norman is a 33 y.o. female presents with concern for a small cut to her left index finger from a pair of scissors earlier today.  Reports that she cleaned it at work and they covered it with a skin glue.  However, she went home and tried to clean it further with hydrogen peroxide.  It began dropping, and so she presented to the emergency room.  Denies any numbness in the left index finger.  Denies any ongoing bleeding.  Unknown when her last tetanus was.    Laceration      Prior to Admission medications  Not on File    Allergies: Patient has no known allergies.    Review of Systems  Skin:  Positive for wound.    Updated Vital Signs BP 133/77   Pulse 84   Temp 98.7 F (37.1 C)   Resp 20   Ht 5' 8 (1.727 m)   Wt 90.7 kg   SpO2 99%   BMI 30.41 kg/m   Physical Exam Vitals and nursing note reviewed.  Constitutional:      Appearance: Normal appearance.  HENT:     Head: Atraumatic.  Cardiovascular:     Comments: Brisk cap refill to the left index finger Pulmonary:     Effort: Pulmonary effort is normal.  Musculoskeletal:     Comments: Left index finger  General 1 cm superficial laceration to the dorsal aspect of the left index finger overlying the DIP joint.  No active bleeding.  No foreign body noted.  Palpation Non-tender over the proximal, middle, or distal phalanx  ROM Full flexion and extension at the wrist Full flexion and extension at the MCP, PIP, DIP  Sensation: Sensation intact throughout the finger   Neurological:     General: No focal deficit present.     Mental Status: She is alert.     Comments: Intact sensation of the left index finger  Psychiatric:        Mood and Affect: Mood normal.        Behavior: Behavior normal.       (all labs ordered are  listed, but only abnormal results are displayed) Labs Reviewed - No data to display  EKG: None  Radiology: No results found.   .Laceration Repair  Date/Time: 07/12/2024 3:06 PM  Performed by: Veta Palma, PA-C Authorized by: Veta Palma, PA-C   Consent:    Consent obtained:  Verbal   Consent given by:  Patient   Risks, benefits, and alternatives were discussed: yes     Risks discussed:  Infection, pain and poor cosmetic result   Alternatives discussed:  No treatment Universal protocol:    Procedure explained and questions answered to patient or proxy's satisfaction: yes     Patient identity confirmed:  Verbally with patient Anesthesia:    Anesthesia method:  None Laceration details:    Location:  Finger   Finger location:  L index finger   Length (cm):  1   Depth (mm):  1 Pre-procedure details:    Preparation:  Imaging obtained to evaluate for foreign bodies and patient was prepped and draped in usual sterile fashion Exploration:    Wound exploration: wound explored through full range of motion and entire depth of wound visualized     Wound extent: no foreign  body, no signs of injury, no nerve damage, no tendon damage, no underlying fracture and no vascular damage     Contaminated: no   Treatment:    Area cleansed with:  Chlorhexidine   Amount of cleaning:  Standard   Debridement:  None Skin repair:    Repair method:  Tissue adhesive (Dermabond) Repair type:    Repair type:  Simple Post-procedure details:    Dressing:  Open (no dressing)   Procedure completion:  Tolerated well, no immediate complications    Medications Ordered in the ED  Tdap (ADACEL) injection 0.5 mL (0.5 mLs Intramuscular Given 07/12/24 1504)  ibuprofen  (ADVIL ) tablet 600 mg (600 mg Oral Given 07/12/24 1503)                                    Medical Decision Making Risk Prescription drug management.    Differential diagnosis includes but is not limited to laceration,  wound infection, tendon injury, nerve injury, vascular injury, retained foreign body, fracture, dislocation  ED Course:  Upon initial evaluation, patient is well-appearing, no acute distress.  Sustained a very small 1 cm superficial laceration over the dorsal aspect of the left index finger DIP just prior to arrival.  No active bleeding.  No visualized foreign body.  Full range of motion of the left index finger, no concern for tendon injury.  She remains neurovascularly intact in the left index finger.  No concern for vascular or nerve injury. Do not feel she needs any imaging of the left index finger given the extremely superficial nature and ability to visualize the full depth of the wound without seeing any foreign body.  No concern for associated fracture based on mechanism of injury and superficial nature.  Medications Given: Tdap Ibuprofen   Patient's wound was cleaned well with chlorhexidine swabs. Patient's laceration was repaired with dermabond. Patient tolerated this well without any immediate complications. Repair occurred less than 24 hours after initial injury. Patient remains neurovascularly intact after suture placement. Patient's Tdap was updated. Patient stable and appropriate for discharge home.     Impression: Left index finger laceration  Disposition:  Patient discharged home with instructions to have Dermabond to peel off on its own.  Tylenol  and ibuprofen  as needed for pain.  Return precautions given and patient verbalized understanding.    This chart was dictated using voice recognition software, Dragon. Despite the best efforts of this provider to proofread and correct errors, errors may still occur which can change documentation meaning.       Final diagnoses:  Laceration of left index finger without foreign body without damage to nail, initial encounter    ED Discharge Orders     None          Veta Palma, [place] 07/12/24 1516  "

## 2024-07-12 NOTE — ED Triage Notes (Signed)
 Arrives POV with complaints of sustaining a small laceration to her left index finger today. Patient cut her finger while using scissors at work. Site was cleaned and glue applied right after laceration. Unknown tetanus status.
